# Patient Record
Sex: Female | Born: 1945 | Race: White | Hispanic: No | Smoking: Never smoker
Health system: Southern US, Community
[De-identification: ages and names within clinical notes are randomized; demographics above are authoritative.]

## PROBLEM LIST (undated history)

## (undated) DIAGNOSIS — R011 Cardiac murmur, unspecified: Secondary | ICD-10-CM

## (undated) DIAGNOSIS — J45909 Unspecified asthma, uncomplicated: Secondary | ICD-10-CM

## (undated) DIAGNOSIS — K219 Gastro-esophageal reflux disease without esophagitis: Secondary | ICD-10-CM

## (undated) DIAGNOSIS — Z860101 Personal history of adenomatous and serrated colon polyps: Secondary | ICD-10-CM

## (undated) DIAGNOSIS — N816 Rectocele: Secondary | ICD-10-CM

## (undated) DIAGNOSIS — F419 Anxiety disorder, unspecified: Secondary | ICD-10-CM

## (undated) DIAGNOSIS — H2513 Age-related nuclear cataract, bilateral: Secondary | ICD-10-CM

## (undated) DIAGNOSIS — K5792 Diverticulitis of intestine, part unspecified, without perforation or abscess without bleeding: Secondary | ICD-10-CM

## (undated) DIAGNOSIS — H33329 Round hole, unspecified eye: Secondary | ICD-10-CM

## (undated) DIAGNOSIS — B351 Tinea unguium: Secondary | ICD-10-CM

## (undated) DIAGNOSIS — N390 Urinary tract infection, site not specified: Secondary | ICD-10-CM

## (undated) DIAGNOSIS — F329 Major depressive disorder, single episode, unspecified: Secondary | ICD-10-CM

## (undated) DIAGNOSIS — J018 Other acute sinusitis: Secondary | ICD-10-CM

## (undated) DIAGNOSIS — Z86010 Personal history of colonic polyps: Secondary | ICD-10-CM

## (undated) DIAGNOSIS — E785 Hyperlipidemia, unspecified: Secondary | ICD-10-CM

## (undated) DIAGNOSIS — M1990 Unspecified osteoarthritis, unspecified site: Secondary | ICD-10-CM

## (undated) DIAGNOSIS — K589 Irritable bowel syndrome without diarrhea: Secondary | ICD-10-CM

## (undated) DIAGNOSIS — K5790 Diverticulosis of intestine, part unspecified, without perforation or abscess without bleeding: Secondary | ICD-10-CM

## (undated) DIAGNOSIS — J309 Allergic rhinitis, unspecified: Secondary | ICD-10-CM

## (undated) DIAGNOSIS — D352 Benign neoplasm of pituitary gland: Secondary | ICD-10-CM

## (undated) DIAGNOSIS — M8580 Other specified disorders of bone density and structure, unspecified site: Secondary | ICD-10-CM

## (undated) DIAGNOSIS — F32A Depression, unspecified: Secondary | ICD-10-CM

## (undated) DIAGNOSIS — H35371 Puckering of macula, right eye: Secondary | ICD-10-CM

## (undated) DIAGNOSIS — H35351 Cystoid macular degeneration, right eye: Secondary | ICD-10-CM

## (undated) HISTORY — DX: Major depressive disorder, single episode, unspecified: F32.9

## (undated) HISTORY — DX: Tinea unguium: B35.1

## (undated) HISTORY — PX: FLEXIBLE SIGMOIDOSCOPY: SHX1649

## (undated) HISTORY — DX: Round hole, unspecified eye: H33.329

## (undated) HISTORY — DX: Diverticulosis of intestine, part unspecified, without perforation or abscess without bleeding: K57.90

## (undated) HISTORY — PX: FOOT SURGERY: SHX648

## (undated) HISTORY — DX: Hyperlipidemia, unspecified: E78.5

## (undated) HISTORY — DX: Diverticulitis of intestine, part unspecified, without perforation or abscess without bleeding: K57.92

## (undated) HISTORY — DX: Gastro-esophageal reflux disease without esophagitis: K21.9

## (undated) HISTORY — PX: ESOPHAGOGASTRODUODENOSCOPY: SHX1529

## (undated) HISTORY — DX: Irritable bowel syndrome, unspecified: K58.9

## (undated) HISTORY — PX: CATARACT EXTRACTION: SUR2

## (undated) HISTORY — DX: Unspecified asthma, uncomplicated: J45.909

## (undated) HISTORY — DX: Allergic rhinitis, unspecified: J30.9

## (undated) HISTORY — DX: Rectocele: N81.6

## (undated) HISTORY — PX: COLONOSCOPY: SHX174

## (undated) HISTORY — DX: Other specified disorders of bone density and structure, unspecified site: M85.80

## (undated) HISTORY — PX: WISDOM TOOTH EXTRACTION: SHX21

## (undated) HISTORY — DX: Personal history of adenomatous and serrated colon polyps: Z86.0101

## (undated) HISTORY — DX: Other acute sinusitis: J01.80

## (undated) HISTORY — DX: Benign neoplasm of pituitary gland: D35.2

## (undated) HISTORY — DX: Depression, unspecified: F32.A

## (undated) HISTORY — DX: Personal history of colonic polyps: Z86.010

## (undated) HISTORY — DX: Anxiety disorder, unspecified: F41.9

---
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
## 1974-02-19 HISTORY — PX: BREAST BIOPSY: SHX20

## 1997-06-24 ENCOUNTER — Other Ambulatory Visit: Admission: RE | Admit: 1997-06-24 | Discharge: 1997-06-24 | Payer: Self-pay | Admitting: Obstetrics and Gynecology

## 1998-08-30 ENCOUNTER — Other Ambulatory Visit: Admission: RE | Admit: 1998-08-30 | Discharge: 1998-08-30 | Payer: Self-pay | Admitting: Obstetrics and Gynecology

## 1998-12-28 ENCOUNTER — Ambulatory Visit: Admission: RE | Admit: 1998-12-28 | Discharge: 1998-12-28 | Payer: Self-pay | Admitting: Pulmonary Disease

## 1999-01-11 ENCOUNTER — Encounter: Payer: Self-pay | Admitting: Gastroenterology

## 1999-01-11 ENCOUNTER — Encounter (INDEPENDENT_AMBULATORY_CARE_PROVIDER_SITE_OTHER): Payer: Self-pay

## 1999-01-11 ENCOUNTER — Other Ambulatory Visit: Admission: RE | Admit: 1999-01-11 | Discharge: 1999-01-11 | Payer: Self-pay | Admitting: Gastroenterology

## 1999-09-21 ENCOUNTER — Other Ambulatory Visit: Admission: RE | Admit: 1999-09-21 | Discharge: 1999-09-21 | Payer: Self-pay | Admitting: Obstetrics and Gynecology

## 2000-09-19 ENCOUNTER — Other Ambulatory Visit: Admission: RE | Admit: 2000-09-19 | Discharge: 2000-09-19 | Payer: Self-pay | Admitting: Obstetrics and Gynecology

## 2001-08-12 ENCOUNTER — Encounter: Payer: Self-pay | Admitting: Gastroenterology

## 2001-10-10 ENCOUNTER — Other Ambulatory Visit: Admission: RE | Admit: 2001-10-10 | Discharge: 2001-10-10 | Payer: Self-pay | Admitting: Obstetrics and Gynecology

## 2002-11-27 ENCOUNTER — Other Ambulatory Visit: Admission: RE | Admit: 2002-11-27 | Discharge: 2002-11-27 | Payer: Self-pay | Admitting: Obstetrics and Gynecology

## 2003-12-22 ENCOUNTER — Other Ambulatory Visit: Payer: Self-pay

## 2003-12-22 ENCOUNTER — Emergency Department: Payer: Self-pay | Admitting: Emergency Medicine

## 2004-02-17 ENCOUNTER — Other Ambulatory Visit: Admission: RE | Admit: 2004-02-17 | Discharge: 2004-02-17 | Payer: Self-pay | Admitting: Obstetrics and Gynecology

## 2004-07-26 ENCOUNTER — Ambulatory Visit: Payer: Self-pay | Admitting: Gastroenterology

## 2004-09-01 ENCOUNTER — Ambulatory Visit: Payer: Self-pay | Admitting: Gastroenterology

## 2004-09-18 ENCOUNTER — Emergency Department: Payer: Self-pay | Admitting: Emergency Medicine

## 2004-09-27 ENCOUNTER — Ambulatory Visit: Payer: Self-pay | Admitting: Gastroenterology

## 2004-10-02 ENCOUNTER — Ambulatory Visit: Payer: Self-pay | Admitting: Gastroenterology

## 2005-03-09 ENCOUNTER — Other Ambulatory Visit: Admission: RE | Admit: 2005-03-09 | Discharge: 2005-03-09 | Payer: Self-pay | Admitting: Obstetrics and Gynecology

## 2005-09-14 ENCOUNTER — Ambulatory Visit: Payer: Self-pay | Admitting: Critical Care Medicine

## 2006-03-22 ENCOUNTER — Ambulatory Visit: Payer: Self-pay | Admitting: Critical Care Medicine

## 2006-04-30 ENCOUNTER — Other Ambulatory Visit: Admission: RE | Admit: 2006-04-30 | Discharge: 2006-04-30 | Payer: Self-pay | Admitting: Obstetrics and Gynecology

## 2007-01-03 DIAGNOSIS — J4530 Mild persistent asthma, uncomplicated: Secondary | ICD-10-CM | POA: Insufficient documentation

## 2007-01-03 DIAGNOSIS — E785 Hyperlipidemia, unspecified: Secondary | ICD-10-CM | POA: Insufficient documentation

## 2007-01-03 DIAGNOSIS — J309 Allergic rhinitis, unspecified: Secondary | ICD-10-CM | POA: Insufficient documentation

## 2007-02-27 ENCOUNTER — Ambulatory Visit: Payer: Self-pay | Admitting: Critical Care Medicine

## 2007-05-05 ENCOUNTER — Other Ambulatory Visit: Admission: RE | Admit: 2007-05-05 | Discharge: 2007-05-05 | Payer: Self-pay | Admitting: Obstetrics and Gynecology

## 2007-08-05 ENCOUNTER — Telehealth: Payer: Self-pay | Admitting: Gastroenterology

## 2007-09-22 ENCOUNTER — Telehealth (INDEPENDENT_AMBULATORY_CARE_PROVIDER_SITE_OTHER): Payer: Self-pay | Admitting: *Deleted

## 2007-11-07 ENCOUNTER — Ambulatory Visit: Payer: Self-pay | Admitting: Critical Care Medicine

## 2007-11-26 ENCOUNTER — Telehealth (INDEPENDENT_AMBULATORY_CARE_PROVIDER_SITE_OTHER): Payer: Self-pay | Admitting: *Deleted

## 2007-12-10 ENCOUNTER — Encounter: Payer: Self-pay | Admitting: Critical Care Medicine

## 2008-01-02 ENCOUNTER — Encounter: Payer: Self-pay | Admitting: Critical Care Medicine

## 2008-03-16 ENCOUNTER — Telehealth (INDEPENDENT_AMBULATORY_CARE_PROVIDER_SITE_OTHER): Payer: Self-pay | Admitting: *Deleted

## 2008-03-19 ENCOUNTER — Encounter: Payer: Self-pay | Admitting: Critical Care Medicine

## 2008-04-30 ENCOUNTER — Telehealth (INDEPENDENT_AMBULATORY_CARE_PROVIDER_SITE_OTHER): Payer: Self-pay | Admitting: *Deleted

## 2008-05-07 ENCOUNTER — Ambulatory Visit: Payer: Self-pay | Admitting: Obstetrics and Gynecology

## 2008-05-07 ENCOUNTER — Encounter: Payer: Self-pay | Admitting: Obstetrics and Gynecology

## 2008-05-07 ENCOUNTER — Other Ambulatory Visit: Admission: RE | Admit: 2008-05-07 | Discharge: 2008-05-07 | Payer: Self-pay | Admitting: Obstetrics and Gynecology

## 2008-06-24 ENCOUNTER — Telehealth (INDEPENDENT_AMBULATORY_CARE_PROVIDER_SITE_OTHER): Payer: Self-pay | Admitting: *Deleted

## 2008-06-24 ENCOUNTER — Ambulatory Visit: Payer: Self-pay | Admitting: Critical Care Medicine

## 2008-07-01 ENCOUNTER — Telehealth (INDEPENDENT_AMBULATORY_CARE_PROVIDER_SITE_OTHER): Payer: Self-pay | Admitting: *Deleted

## 2008-07-01 ENCOUNTER — Telehealth: Payer: Self-pay | Admitting: Critical Care Medicine

## 2008-07-05 ENCOUNTER — Ambulatory Visit: Payer: Self-pay | Admitting: Gastroenterology

## 2008-07-05 DIAGNOSIS — Z860100 Personal history of colon polyps, unspecified: Secondary | ICD-10-CM | POA: Insufficient documentation

## 2008-07-15 ENCOUNTER — Telehealth (INDEPENDENT_AMBULATORY_CARE_PROVIDER_SITE_OTHER): Payer: Self-pay | Admitting: *Deleted

## 2008-07-16 ENCOUNTER — Ambulatory Visit: Payer: Self-pay | Admitting: Gastroenterology

## 2008-07-16 ENCOUNTER — Encounter: Payer: Self-pay | Admitting: Gastroenterology

## 2008-07-21 ENCOUNTER — Encounter: Payer: Self-pay | Admitting: Gastroenterology

## 2008-07-28 ENCOUNTER — Telehealth: Payer: Self-pay | Admitting: Critical Care Medicine

## 2008-07-29 ENCOUNTER — Encounter: Payer: Self-pay | Admitting: Gastroenterology

## 2008-08-24 ENCOUNTER — Telehealth: Payer: Self-pay | Admitting: Gastroenterology

## 2008-09-07 ENCOUNTER — Telehealth (INDEPENDENT_AMBULATORY_CARE_PROVIDER_SITE_OTHER): Payer: Self-pay | Admitting: *Deleted

## 2008-09-07 ENCOUNTER — Ambulatory Visit: Payer: Self-pay | Admitting: Pulmonary Disease

## 2008-09-20 ENCOUNTER — Telehealth (INDEPENDENT_AMBULATORY_CARE_PROVIDER_SITE_OTHER): Payer: Self-pay | Admitting: *Deleted

## 2008-09-23 ENCOUNTER — Telehealth: Payer: Self-pay | Admitting: Critical Care Medicine

## 2008-09-28 ENCOUNTER — Encounter: Payer: Self-pay | Admitting: Critical Care Medicine

## 2008-10-01 ENCOUNTER — Ambulatory Visit: Payer: Self-pay | Admitting: Internal Medicine

## 2008-10-01 ENCOUNTER — Telehealth (INDEPENDENT_AMBULATORY_CARE_PROVIDER_SITE_OTHER): Payer: Self-pay | Admitting: *Deleted

## 2008-10-01 DIAGNOSIS — R05 Cough: Secondary | ICD-10-CM

## 2008-10-01 DIAGNOSIS — R059 Cough, unspecified: Secondary | ICD-10-CM | POA: Insufficient documentation

## 2008-10-12 ENCOUNTER — Telehealth (INDEPENDENT_AMBULATORY_CARE_PROVIDER_SITE_OTHER): Payer: Self-pay | Admitting: *Deleted

## 2008-10-15 ENCOUNTER — Telehealth: Payer: Self-pay | Admitting: Gastroenterology

## 2008-10-15 ENCOUNTER — Telehealth: Payer: Self-pay | Admitting: Critical Care Medicine

## 2008-11-08 ENCOUNTER — Encounter: Payer: Self-pay | Admitting: Critical Care Medicine

## 2009-02-07 ENCOUNTER — Telehealth (INDEPENDENT_AMBULATORY_CARE_PROVIDER_SITE_OTHER): Payer: Self-pay | Admitting: *Deleted

## 2009-02-21 ENCOUNTER — Ambulatory Visit: Payer: Self-pay | Admitting: Gynecology

## 2009-05-12 ENCOUNTER — Ambulatory Visit: Payer: Self-pay | Admitting: Obstetrics and Gynecology

## 2009-05-12 ENCOUNTER — Other Ambulatory Visit: Admission: RE | Admit: 2009-05-12 | Discharge: 2009-05-12 | Payer: Self-pay | Admitting: Obstetrics and Gynecology

## 2009-06-28 ENCOUNTER — Ambulatory Visit: Payer: Self-pay | Admitting: Obstetrics and Gynecology

## 2009-06-28 ENCOUNTER — Telehealth (INDEPENDENT_AMBULATORY_CARE_PROVIDER_SITE_OTHER): Payer: Self-pay | Admitting: *Deleted

## 2009-07-01 ENCOUNTER — Ambulatory Visit: Payer: Self-pay | Admitting: Critical Care Medicine

## 2009-07-01 ENCOUNTER — Telehealth (INDEPENDENT_AMBULATORY_CARE_PROVIDER_SITE_OTHER): Payer: Self-pay | Admitting: *Deleted

## 2009-07-07 ENCOUNTER — Ambulatory Visit: Payer: Self-pay | Admitting: Obstetrics and Gynecology

## 2009-08-25 ENCOUNTER — Ambulatory Visit: Payer: Self-pay | Admitting: Obstetrics and Gynecology

## 2009-09-07 ENCOUNTER — Ambulatory Visit: Payer: Self-pay | Admitting: Obstetrics and Gynecology

## 2009-10-10 ENCOUNTER — Ambulatory Visit: Payer: Self-pay | Admitting: Unknown Physician Specialty

## 2009-10-12 LAB — PATHOLOGY REPORT

## 2009-11-07 ENCOUNTER — Telehealth (INDEPENDENT_AMBULATORY_CARE_PROVIDER_SITE_OTHER): Payer: Self-pay | Admitting: *Deleted

## 2009-11-22 ENCOUNTER — Encounter: Payer: Self-pay | Admitting: Critical Care Medicine

## 2010-01-25 ENCOUNTER — Telehealth (INDEPENDENT_AMBULATORY_CARE_PROVIDER_SITE_OTHER): Payer: Self-pay | Admitting: *Deleted

## 2010-03-09 ENCOUNTER — Telehealth: Payer: Self-pay | Admitting: Critical Care Medicine

## 2010-03-12 ENCOUNTER — Encounter: Payer: Self-pay | Admitting: Cardiovascular Disease

## 2010-03-23 NOTE — Medication Information (Signed)
Summary: Tax adviser   Imported By: Lehman Prom 11/22/2009 08:04:56  _____________________________________________________________________  External Attachment:    Type:   Image     Comment:   External Document

## 2010-03-23 NOTE — Progress Notes (Signed)
Summary: rx clarification- pharm calling  Phone Note From Pharmacy   [facility] [place] Call For: [facility]  Summary of Call: pharmacy req to confirm rx's for pt- especially re: chlor-trimeton and azelastine. call robin at [phone] Initial call taken by: Tivis Ringer, CNA,  Jul 01, 2009 3:06 PM  Follow-up for Phone Call        Pharmacist aware that the pt was RX'd 2 antihistamines per Dr. Delford Field and she should follow his instructions for use as instructed at OV today.Michel Bickers CMA  Jul 01, 2009 3:11 PM

## 2010-03-23 NOTE — Progress Notes (Signed)
  Phone Note Other Incoming   Request: Send information Summary of Call: Request for records received from [facility]. Request forwarded to [facility].     

## 2010-03-23 NOTE — Progress Notes (Signed)
Summary: question about pneumonia shot  Phone Note Call from Patient Call back at Home Phone [phone]   Caller: Patient Call For: Naomie Crow Summary of Call: Pt wants to know at what age should she start taking pnx shot. 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 I advsied the rec of 1 after age 64 years old becuase it shows no extra benefit to have additional vaccines after that age.  I also advsied pt that she could discuss with Dr. Delford Field or her PCP as well to see what their recs are. She states she will think about this and call if needed. Carron Curie CMA  March 09, 2010 11:44 AM

## 2010-03-23 NOTE — Progress Notes (Signed)
Summary: talk to nurse-PA for Veramyst- Approved!!!!  Phone Note Call from Patient   Caller: Patient Call For: wright Summary of Call: pt would like to talk to nurse about vermyst. Initial call taken by: Rickard Patience,  November 07, 2009 2:59 PM  Follow-up for Phone Call        [email or web address]. Carron Curie CMA  November 07, 2009 4:08 PM   pt returned our call.  pt states she received a letter in the mail from her ins company informing her she needed to contact us to let us know her prior auth for her Madilyn Hook is about to expire and therefore we need to call [employer] at [phone] and request renewal form.  Case # is [id number].  Called [employer] and requested renewal form be faxed to our office. Aundra Millet Reynolds LPN  November 07, 2009 4:49 PM   received prior auth form and put in PW's look at for him to fill out and sign.  Aundra Millet Reynolds LPN  November 10, 2009 9:15 AM    Additional Follow-up for Phone Call Additional follow up Details #1::        Form completed by PW and faxed back to [employer] at [phone].  Form placed in triage with the awaiting approval/denial forms.  Gweneth Dimitri RN  November 11, 2009 5:07 PM     Additional Follow-up for Phone Call Additional follow up Details #2::    received fax back from [employer].  Veramyst approved from 10/21/2009 until 11/11/2011.  Pt aware.  Aundra Millet Reynolds LPN  November 14, 2009 3:19 PM

## 2010-03-23 NOTE — Progress Notes (Signed)
Summary: prescript  Phone Note Call from Patient   Caller: Patient Call For: wright Summary of Call: need refill for singulair have appt with dr Delford Field on friday [facility] [address] [place] Initial call taken by: Rickard Patience,  Jun 28, 2009 4:14 PM  Follow-up for Phone Call        pt has pending appt with PW on Friday 07-01-2009. Sent rx for Singulair to pt's pharmacy.  Pt aware to keep pending appt for future refills.  Aundra Millet Reynolds LPN  Jun 28, 2009 4:35 PM     Prescriptions: SINGULAIR 10 MG TABS (MONTELUKAST SODIUM) Take 1 tablet by mouth once a day  #30 x 0   Entered by:   Arman Filter LPN   Authorized by:   Storm Frisk MD   Signed by:   Arman Filter LPN on 16/11/9602   Method used:   [facility]        [facility] [phone]* (retail)       [address] [place], [place]  [address]       Ph: [phone] or [phone]       Fax: [phone]   RxID:   [id number]
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
## 2010-05-16 ENCOUNTER — Encounter: Payer: Self-pay | Admitting: Obstetrics and Gynecology

## 2010-06-01 ENCOUNTER — Other Ambulatory Visit (HOSPITAL_COMMUNITY)
Admission: RE | Admit: 2010-06-01 | Discharge: 2010-06-01 | Disposition: A | Discharge status: Home / Self Care | Payer: BC Managed Care – PPO | Source: Ambulatory Visit | Attending: Obstetrics and Gynecology | Admitting: Obstetrics and Gynecology

## 2010-06-01 ENCOUNTER — Other Ambulatory Visit: Payer: Self-pay | Admitting: Obstetrics and Gynecology

## 2010-06-01 ENCOUNTER — Encounter (INDEPENDENT_AMBULATORY_CARE_PROVIDER_SITE_OTHER): Payer: BC Managed Care – PPO | Admitting: Obstetrics and Gynecology

## 2010-06-01 DIAGNOSIS — B373 Candidiasis of vulva and vagina: Secondary | ICD-10-CM

## 2010-06-01 DIAGNOSIS — Z01419 Encounter for gynecological examination (general) (routine) without abnormal findings: Secondary | ICD-10-CM

## 2010-06-01 DIAGNOSIS — R82998 Other abnormal findings in urine: Secondary | ICD-10-CM

## 2010-06-01 DIAGNOSIS — Z13220 Encounter for screening for lipoid disorders: Secondary | ICD-10-CM

## 2010-06-01 DIAGNOSIS — Z124 Encounter for screening for malignant neoplasm of cervix: Secondary | ICD-10-CM | POA: Insufficient documentation

## 2010-06-01 DIAGNOSIS — B3731 Acute candidiasis of vulva and vagina: Secondary | ICD-10-CM

## 2010-06-01 DIAGNOSIS — Z833 Family history of diabetes mellitus: Secondary | ICD-10-CM

## 2010-06-14 ENCOUNTER — Ambulatory Visit (INDEPENDENT_AMBULATORY_CARE_PROVIDER_SITE_OTHER): Payer: BC Managed Care – PPO | Admitting: Obstetrics and Gynecology

## 2010-06-14 ENCOUNTER — Other Ambulatory Visit: Payer: BC Managed Care – PPO

## 2010-06-14 DIAGNOSIS — N83339 Acquired atrophy of ovary and fallopian tube, unspecified side: Secondary | ICD-10-CM

## 2010-06-14 DIAGNOSIS — N950 Postmenopausal bleeding: Secondary | ICD-10-CM

## 2010-07-07 NOTE — Assessment & Plan Note (Signed)
[facility]                             PULMONARY OFFICE NOTE   NAME:[name], [name]                         MRN:          [id number]  DATE:03/22/2006                            DOB:          03-02-1945    This patient is seen for the first time since July 2007.  She has a  history of moderate persistent asthma.  She is here for reestablishment  for her pulmonary status, still having some nasal congestion, no real  cough, wheezing, or mucous production.  Maintained Zyrtec 10 mg daily,  Prilosec over-the-counter daily, Flonase 3 sprays each nostril b.i.d.,  Singulair 10 mg daily.  She is no longer on Advair.  She uses albuterol  p.r.n.   PHYSICAL EXAMINATION:  On exam, temp 98, blood pressure 108/72, pulse  80, saturation 99% room air.  CHEST:  Showed to be completely clear without evidence of wheeze, rale,  or rhonchi.  Note, there were no adventitious breath sounds.  CARDIAC EXAM:  Showed a regular rate and rhythm without S3, normal S1,  S2.  ABDOMEN:  Soft, nontender.  EXTREMITIES:  Showed no edema or clubbing.  NARIS:  Showed increased nasal inflammation, but no purulence.   IMPRESSION:  Is that of mild intermittent asthma, severe allergic  rhinitis, significant atopic features.   PLAN:  Switch to Xyzal one 5 mg daily.  Begin Veramyst 2 sprays b.i.d.  each nostril.  Discontinue Flonase, discontinue Zyrtec.  Maintain  Singulair.  Discontinue further Advair.  Maintain albuterol p.r.n.  Consider allergy evaluation.  The patient is taking this under  advisement, and she will return to this office in any case in 4 months.     Charlcie Cradle Delford Field, MD, [facility]  Electronically Signed    PEW/MedQ  DD: 03/22/2006  DT: 03/22/2006  Job #: [id number]

## 2010-07-07 NOTE — Assessment & Plan Note (Signed)
[facility]                               PULMONARY OFFICE NOTE   NAME:[name], [name]                         MRN:          [id number]  DATE:09/14/2005                            DOB:          April 17, 1945    CHIEF COMPLAINT:  Evaluate chronic asthma.   HISTORY OF PRESENT ILLNESS:  This is a 65 year old white female who needs to  establish here for pulmonary.  She had been seen by Dr. Adonis Huguenin in [place]  and wishes to establish in [place] for a pulmonary followup.  She has had  longstanding asthmatic bronchitis for many years.  She is currently in a  stable condition.  She has shortness of breath, extreme exertion.  She is  not short of breath at rest.  She occasionally has a productive cough when  she is having bronchitis flare-up and currently does not have any cough, has  only chest pain when very stressed.  She is not short of breath unless she  has extreme exertion.  She does have a history of sleep apnea that is being  evaluated by Dr. Kandis Cocking office.  She does have nasal congestion, denies  any acid heartburn or reflux symptoms, denies any change of appetite, denies  abdominal pain, difficulty swallowing, sore throat, tooth or dental  problems.  She has an Advair inhaler but only uses it p.r.n. and uses  albuterol only p.r.n.  She is referred now for further evaluation.   PAST MEDICAL HISTORY:  No history of hypertension or heart disease, does  have asthmatic bronchitis.  First diagnosed with asthma in 1991.  Has  hypercholesterolemia, chronic allergies and possible sleep apnea.   OPERATIVE HISTORY:  Three colonoscopies in the past, breast biopsy in 1976  was benign.  Tonsils were removed in 1950.   MEDICATION ALLERGIES:  SULFA CAUSE HIVES.   CURRENT MEDICATIONS:  1.  Zyrtec 10 mg daily.  2.  Prilosec over the counter, 1 daily.  3.  Rhinocort and Flonase both daily.  4.  Singulair 10 mg daily.  5.  Advair and albuterol p.r.n.   SOCIAL HISTORY:  Patient is a lifelong never smoker, is a Runner, broadcasting/film/video, was a  principle in the past, lives by herself, is divorced.   FAMILY HISTORY:  Emphysema in a father.  Allergies in mother and brother,  had asthma.  Mother and father had heart disease and the father with lung  cancer.   REVIEW OF SYSTEMS:  Otherwise noncontributory.  Does have a history of  anxiety and depression.   PHYSICAL EXAMINATION:  GENERAL:  This is a middle-aged female in no  distress.  VITAL SIGNS:  Temp 98.5, blood pressure 130/86, pulse 77, saturation 96% on  room air.  CHEST:  Showed distant breath sounds with prolonged expiratory phase.  No  wheeze or rhonchi noted.  CARDIAC:  Showed a regular rate and rhythm without S3.  Normal S1, S2.  ABDOMEN:  Soft, nontender.  EXTREMITIES:  No edema or clubbing or venous disease.  SKIN:  Clear.  NEUROLOGIC:  Intact.  HEENT:  No jugular venous distention,  no lymphadenopathy.  Oropharynx clear.  NECK:  Supple.   LABORATORY DATA:  Chest x-ray was previously obtained and reviewed and  revealed COPD changes, no acute infiltrate seen. Pulmonary functions are not  available for review but apparently have been done previously.   IMPRESSION:  Is that of asthmatic bronchitis with associated allergic  rhinitis.  Patient is currently stable at this time but does have pending  records from Dr. Adonis Huguenin that are being sent that we have not yet received.   RECOMMENDATIONS:  Would be to discontinue the Rhinocort and maintain  Flonase, two sprays each nostril daily, maintain Singulair 10 mg daily,  Prilosec over the counter daily.  The patient was instructed as to the use  of Advair on a more consistent basis at one spray b.i.d., 100/50 strength,  one spray b.i.d.  No other change in plan appears made, and we will await  records from Dr. Kinnie Scales office for making an additional recommendations.                                   Charlcie Cradle Delford Field, MD, [facility]   PEW/MedQ  DD:   09/17/2005  DT:  09/17/2005  Job #:  [id number]   cc:   Reuel Boom L. Eda Paschal, MD

## 2010-07-11 ENCOUNTER — Other Ambulatory Visit: Payer: Self-pay | Admitting: Critical Care Medicine

## 2010-07-13 NOTE — Telephone Encounter (Signed)
Pt was last seen 06/2009 and was told to f/u in 1 yr.  She has a pending appt with Dr. Delford Field for 08/01/2010.  Will send refills to last until OV.

## 2010-07-18 ENCOUNTER — Ambulatory Visit (INDEPENDENT_AMBULATORY_CARE_PROVIDER_SITE_OTHER): Payer: BC Managed Care – PPO | Admitting: Obstetrics and Gynecology

## 2010-07-18 DIAGNOSIS — B3731 Acute candidiasis of vulva and vagina: Secondary | ICD-10-CM

## 2010-07-18 DIAGNOSIS — N898 Other specified noninflammatory disorders of vagina: Secondary | ICD-10-CM

## 2010-07-18 DIAGNOSIS — B373 Candidiasis of vulva and vagina: Secondary | ICD-10-CM

## 2010-07-18 DIAGNOSIS — N760 Acute vaginitis: Secondary | ICD-10-CM

## 2010-07-20 ENCOUNTER — Telehealth: Payer: Self-pay | Admitting: Critical Care Medicine

## 2010-07-20 NOTE — Telephone Encounter (Signed)
Ok

## 2010-07-20 NOTE — Telephone Encounter (Signed)
Called and spoke with pt. Informed her we are working on PA for [employer] and will let her know when this has been approved or denied. PW, i placed the PA form in your look at stack for you to fill out and sign.  Thank you.

## 2010-07-21 DIAGNOSIS — M899 Disorder of bone, unspecified: Secondary | ICD-10-CM | POA: Insufficient documentation

## 2010-07-21 DIAGNOSIS — M8580 Other specified disorders of bone density and structure, unspecified site: Secondary | ICD-10-CM

## 2010-07-21 HISTORY — DX: Other specified disorders of bone density and structure, unspecified site: M85.80

## 2010-07-27 ENCOUNTER — Telehealth: Payer: Self-pay | Admitting: Critical Care Medicine

## 2010-07-27 MED ORDER — MONTELUKAST SODIUM 10 MG PO TABS: 10.0000 mg | ORAL_TABLET | Freq: Every day | ORAL | Status: DC

## 2010-07-27 NOTE — Telephone Encounter (Signed)
Rx refill for singulair sent to pharm.

## 2010-07-27 NOTE — Telephone Encounter (Signed)
PA has  Been done for singulair 10mg    Once daily,  i [facility].  i called and spoke with pt and she is aware that this has been approved through 07-27-2011

## 2010-07-27 NOTE — Telephone Encounter (Signed)
PA has been completed today and pt is aware that her meds have been approved and called to the pharmacy

## 2010-08-01 ENCOUNTER — Encounter (INDEPENDENT_AMBULATORY_CARE_PROVIDER_SITE_OTHER): Payer: BC Managed Care – PPO

## 2010-08-01 ENCOUNTER — Ambulatory Visit (INDEPENDENT_AMBULATORY_CARE_PROVIDER_SITE_OTHER): Payer: BC Managed Care – PPO | Admitting: Critical Care Medicine

## 2010-08-01 ENCOUNTER — Encounter: Payer: Self-pay | Admitting: Critical Care Medicine

## 2010-08-01 VITALS — BP 120/80 | HR 84 | Temp 98.3°F | Ht 64.0 in | Wt 178.6 lb

## 2010-08-01 DIAGNOSIS — M949 Disorder of cartilage, unspecified: Secondary | ICD-10-CM

## 2010-08-01 DIAGNOSIS — M899 Disorder of bone, unspecified: Secondary | ICD-10-CM

## 2010-08-01 DIAGNOSIS — J45909 Unspecified asthma, uncomplicated: Secondary | ICD-10-CM

## 2010-08-01 DIAGNOSIS — R350 Frequency of micturition: Secondary | ICD-10-CM

## 2010-08-01 DIAGNOSIS — N390 Urinary tract infection, site not specified: Secondary | ICD-10-CM

## 2010-08-01 MED ORDER — FLUTICASONE FUROATE 27.5 MCG/SPRAY NA SUSP: 2.0000 | Freq: Two times a day (BID) | NASAL | Status: DC

## 2010-08-01 MED ORDER — AZELASTINE HCL 0.15 % NA SOLN: 2.0000 | Freq: Every day | NASAL | Status: DC

## 2010-08-01 MED ORDER — MONTELUKAST SODIUM 10 MG PO TABS: 10.0000 mg | ORAL_TABLET | Freq: Every day | ORAL | Status: DC

## 2010-08-01 NOTE — Progress Notes (Signed)
Subjective:    Patient ID: [name], female    DOB: [birth year], 65 y.o.   MRN: [id number]  HPI 65 y.o. wf never smoker with history of moderate persistent asthma. . The patient had significant allergic rhinitis with postnasal drip syndrome and reflux disease aggravating a cyclic cough.  Jul 01, 2009 2:11 PM  This pt is doing well. No new issues. No wheeze or cough. No excess SABA use . Pt is controlled on singulair.  Pt denies any significant sore throat, nasal congestion or excess secretions, fever, chills, sweats, unintended weight loss, pleurtic or exertional chest pain, orthopnea PND, or leg swelling  Pt denies any increase in rescue therapy over baseline, denies waking up needing it or having any early am or nocturnal exacerbations of coughing/wheezing/or dyspnea.  Pt also denies any obvious fluctuation in symptoms wiht weather or environmental change or other alleviating or aggravating factors   08/01/2010 Since last ov, doing well,  Retired 2008 and stress with this.   Pt denies any significant sore throat, nasal congestion or excess secretions, fever, chills, sweats, unintended weight loss, pleurtic or exertional chest pain, orthopnea PND, or leg swelling Pt denies any increase in rescue therapy over baseline, denies waking up needing it or having any early am or nocturnal exacerbations of coughing/wheezing/or dyspnea. Pt also denies any obvious fluctuation in symptoms with  weather or environmental change or other alleviating or aggravating factors     Review of Systems Constitutional:   No  weight loss, night sweats,  Fevers, chills, fatigue, lassitude. HEENT:   No headaches,  Difficulty swallowing,  Tooth/dental problems,  Sore throat,                Notes some  Sneezing,notes some nose  itching, ear ache, nasal congestion, post nasal drip,   CV:  No chest pain,  Orthopnea, PND, swelling in lower extremities, anasarca, dizziness, palpitations  GI  No heartburn, indigestion,  abdominal pain, nausea, vomiting, diarrhea, change in bowel habits, loss of appetite  Resp: No shortness of breath with exertion or at rest.  No excess mucus, no productive cough,  No non-productive cough,  No coughing up of blood.  No change in color of mucus.  No wheezing.  No chest wall deformity  Skin: no rash or lesions.  GU: no dysuria, change in color of urine, no urgency or frequency.  No flank pain.  MS:  No joint pain or swelling.  No decreased range of motion.  No back pain.  Psych:  No change in mood or affect. No depression or anxiety.  No memory loss.     Objective:   Physical Exam Filed Vitals:   08/01/10 1433  BP: 120/80  Pulse: 84  Temp: 98.3 F (36.8 C)  TempSrc: Oral  Height: 5\' 4"  (1.626 m)  Weight: 178 lb 9.6 oz (81.012 kg)  SpO2: 97%    Gen: Pleasant, well-nourished, in no distress,  normal affect  ENT: No lesions,  mouth clear,  oropharynx clear, no postnasal drip  Neck: No JVD, no TMG, no carotid bruits  Lungs: No use of accessory muscles, no dullness to percussion, clear without rales or rhonchi  Cardiovascular: RRR, heart sounds normal, no murmur or gallops, no peripheral edema  Abdomen: soft and NT, no HSM,  BS normal  Musculoskeletal: No deformities, no cyanosis or clubbing  Neuro: alert, non focal  Skin: Warm, no lesions or rashes      PUL ASTHMA HISTORY 08/01/2010  Symptoms 0-2 days/week  Nighttime  awakenings 0-2/month  Interference with activity No limitations  SABA use 0-2 days/wk  Exacerbations requiring oral steroids 0-1 / year    Assessment & Plan:   ASTHMA Cyclic cough due to mild intermittent asthma stable Plan No change in inhaled or maintenance medications. Return in  12 months     Updated Medication List Outpatient Encounter Prescriptions as of 08/01/2010  Medication Sig Dispense Refill  . albuterol (PROAIR HFA) 108 (90 BASE) MCG/ACT inhaler Inhale 2 puffs into the lungs every 6 (six) hours as needed.        Marland Kitchen  aspirin 81 MG tablet Take 81 mg by mouth every other day.        Marland Kitchen atorvastatin (LIPITOR) 40 MG tablet Take 20 mg by mouth daily. Take 1/2 once daily.       . Azelastine HCl (ASTEPRO) 0.15 % SOLN Place 2 puffs into the nose daily.  30 mL  6  . calcium citrate-vitamin D 200-200 MG-UNIT TABS Take 2 tablets by mouth daily.        . cetirizine (ZYRTEC) 10 MG tablet Take 10 mg by mouth daily.        . chlorpheniramine (CHLOR-TRIMETON) 4 MG tablet Take 4 mg by mouth daily.        . fish oil-omega-3 fatty acids 1000 MG capsule Take 1 g by mouth every other day.        . fluticasone (VERAMYST) 27.5 MCG/SPRAY nasal spray Place 2 sprays into the nose 2 (two) times daily.  10 g  6  . montelukast (SINGULAIR) 10 MG tablet Take 1 tablet (10 mg total) by mouth at bedtime.  30 tablet  0  . Multiple Vitamin (MULTIVITAMIN) tablet Take 1 tablet by mouth daily.        . ranitidine (ZANTAC) 150 MG capsule Take 150 mg by mouth 2 (two) times daily.        Marland Kitchen DISCONTD: Azelastine HCl (ASTEPRO) 0.15 % SOLN Place into the nose daily.       Marland Kitchen DISCONTD: montelukast (SINGULAIR) 10 MG tablet Take 1 tablet (10 mg total) by mouth at bedtime.  30 tablet  0  . DISCONTD: VERAMYST 27.5 MCG/SPRAY nasal spray 2 SPRAYS EACH NOSTRIL ONCE DAILY  1 Bottle  0

## 2010-08-01 NOTE — Patient Instructions (Signed)
No change in medications. Return in       12 months, call sooner if worse

## 2010-08-03 NOTE — Assessment & Plan Note (Addendum)
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 Return in  12 months

## 2010-08-09 ENCOUNTER — Ambulatory Visit: Payer: BC Managed Care – PPO | Admitting: Obstetrics and Gynecology

## 2010-08-09 ENCOUNTER — Other Ambulatory Visit: Payer: BC Managed Care – PPO

## 2010-08-14 ENCOUNTER — Telehealth: Payer: Self-pay | Admitting: *Deleted

## 2010-08-14 NOTE — Telephone Encounter (Signed)
Per previous phone messages, singulair PA has already been done and approved -- pt and pharm aware of this per previous phone messages.  We also have an approval letter for singulair from 07/06/2010 - 07/27/2011.  Will have this scanned into chart.

## 2010-08-28 ENCOUNTER — Other Ambulatory Visit: Payer: Self-pay | Admitting: Critical Care Medicine

## 2010-08-29 NOTE — Telephone Encounter (Signed)
Rx was last sent on 08/01/10 # 1 x 6 -- Called pharm, spoke with Brett Canales.  He states they did receive the rx from June.  This rx was sent in error.

## 2010-11-21 ENCOUNTER — Other Ambulatory Visit: Payer: Self-pay | Admitting: *Deleted

## 2010-11-21 ENCOUNTER — Telehealth: Payer: Self-pay | Admitting: Critical Care Medicine

## 2010-11-21 DIAGNOSIS — J45909 Unspecified asthma, uncomplicated: Secondary | ICD-10-CM

## 2010-11-21 MED ORDER — MONTELUKAST SODIUM 10 MG PO TABS: 10.0000 mg | ORAL_TABLET | Freq: Every day | ORAL | Status: DC

## 2010-11-21 NOTE — Telephone Encounter (Signed)
Called, spoke with pt.  States she was told generic singulair needed authorization.  Looks like rx was sent in on 11/21/10 # 30 x 6 - ? If this is needing a prior authorization.  I called [facility] and spoke with Dieu-ha who states they were needing a refill authorization - they did receive the rx sent earlier today.  Per Dieu-ha, no PA is needed at this time.  Therefore, nothing further needed at this time.  I called pt back and informed her of this.  She verbalized understanding.  Nothing further needed at this time.

## 2010-11-30 ENCOUNTER — Encounter: Payer: Self-pay | Admitting: Critical Care Medicine

## 2010-11-30 ENCOUNTER — Ambulatory Visit (INDEPENDENT_AMBULATORY_CARE_PROVIDER_SITE_OTHER): Payer: BC Managed Care – PPO | Admitting: Critical Care Medicine

## 2010-11-30 DIAGNOSIS — J45909 Unspecified asthma, uncomplicated: Secondary | ICD-10-CM

## 2010-11-30 DIAGNOSIS — J309 Allergic rhinitis, unspecified: Secondary | ICD-10-CM

## 2010-11-30 MED ORDER — FLUTICASONE FUROATE 27.5 MCG/SPRAY NA SUSP: 2.0000 | Freq: Every day | NASAL | Status: DC

## 2010-11-30 MED ORDER — AZELASTINE HCL 0.15 % NA SOLN: 2.0000 | Freq: Every day | NASAL | Status: DC

## 2010-11-30 MED ORDER — ALBUTEROL SULFATE HFA 108 (90 BASE) MCG/ACT IN AERS: 2.0000 | INHALATION_SPRAY | Freq: Four times a day (QID) | RESPIRATORY_TRACT | Status: DC | PRN

## 2010-11-30 NOTE — Progress Notes (Signed)
Subjective:    Patient ID: [name], female    DOB: [birth year], 65 y.o.   MRN: [id number]  HPI  65 y.o. wf never smoker with history of moderate persistent asthma. . The patient had significant allergic rhinitis with postnasal drip syndrome and reflux disease aggravating a cyclic cough.  Jul 01, 2009 2:11 PM  This pt is doing well. No new issues. No wheeze or cough. No excess SABA use . Pt is controlled on singulair.  Pt denies any significant sore throat, nasal congestion or excess secretions, fever, chills, sweats, unintended weight loss, pleurtic or exertional chest pain, orthopnea PND, or leg swelling  Pt denies any increase in rescue therapy over baseline, denies waking up needing it or having any early am or nocturnal exacerbations of coughing/wheezing/or dyspnea.  Pt also denies any obvious fluctuation in symptoms wiht weather or environmental change or other alleviating or aggravating factors   6/12 Since last ov, doing well,  Retired 2008 and stress with this.   Pt denies any significant sore throat, nasal congestion or excess secretions, fever, chills, sweats, unintended weight loss, pleurtic or exertional chest pain, orthopnea PND, or leg swelling Pt denies any increase in rescue therapy over baseline, denies waking up needing it or having any early am or nocturnal exacerbations of coughing/wheezing/or dyspnea. Pt also denies any obvious fluctuation in symptoms with  weather or environmental change or other alleviating or aggravating factors  11/30/2010 3weeks ago started after weed eating, dusty area,  L side.  Sore throat after ate bag of starburst candy 1.5 weeks ago. Pain was dull ache and tightness and becoming more sharp, no change with deep breath.  No real wheeze.  Has GERD, ? May have had more reflux symptoms.  No sore throat is better now.  Pain is better now. Asthma History:   Symptoms  0-2 days/week         Nighttime Awakenings  0-2/month         Asthma interference  with normal activity  No limitations         SABA use (not for EIB)  0-2 days/wk         Risk: Exacerbations requiring oral systemic steroids  0-1 / year            Past Medical History  Diagnosis Date  . Esophageal reflux   . Other acute sinusitis   . Other and unspecified hyperlipidemia   . Unspecified asthma   . Allergic rhinitis, cause unspecified   . Depression   . Chronic bronchitis   . IBS (irritable bowel syndrome)   . Diverticulosis   . Anxiety disorder   . Hx of adenomatous colonic polyps      Family History  Problem Relation Age of Onset  . Colon cancer Father   . Lung cancer Father   . Heart attack Father   . Heart attack Mother   . Liver disease Maternal Grandmother   . Colon polyps Brother   . Prostate cancer Maternal Uncle   . Breast cancer Paternal Aunt   . Breast cancer Paternal Grandmother   . Diabetes Paternal Grandmother   . Stomach cancer Father      History   Social History  . Marital Status: Divorced    Spouse Name: N/A    Number of Children: N/A  . Years of Education: N/A   Occupational History  . retired Runner, broadcasting/film/video    Social History Main Topics  . Smoking status: Never Smoker   .  Smokeless tobacco: Never Used  . Alcohol Use: Not on file  . Drug Use: Not on file  . Sexually Active: Not on file   Other Topics Concern  . Not on file   Social History Narrative  . No narrative on file     Allergies  Allergen Reactions  . Sulfonamide Derivatives      Outpatient Prescriptions Prior to Visit  Medication Sig Dispense Refill  . aspirin 81 MG tablet Take 81 mg by mouth every other day.        Marland Kitchen atorvastatin (LIPITOR) 40 MG tablet Take 20 mg by mouth daily. Take 1/2 once daily.       . calcium citrate-vitamin D 200-200 MG-UNIT TABS Take 2 tablets by mouth daily.        . cetirizine (ZYRTEC) 10 MG tablet Take 10 mg by mouth daily.        . chlorpheniramine (CHLOR-TRIMETON) 4 MG tablet Take 4 mg by mouth daily.        . fish oil-omega-3  fatty acids 1000 MG capsule Take 1 g by mouth every other day.        . montelukast (SINGULAIR) 10 MG tablet Take 1 tablet (10 mg total) by mouth at bedtime.  30 tablet  6  . Multiple Vitamin (MULTIVITAMIN) tablet Take 1 tablet by mouth daily.        . ranitidine (ZANTAC) 150 MG capsule Take 150 mg by mouth 2 (two) times daily.        Marland Kitchen albuterol (PROAIR HFA) 108 (90 BASE) MCG/ACT inhaler Inhale 2 puffs into the lungs every 6 (six) hours as needed.        . Azelastine HCl (ASTEPRO) 0.15 % SOLN Place 2 puffs into the nose daily.  30 mL  6  . fluticasone (VERAMYST) 27.5 MCG/SPRAY nasal spray Place 2 sprays into the nose 2 (two) times daily.  10 g  6      Review of Systems  Constitutional:   No  weight loss, night sweats,  Fevers, chills, fatigue, lassitude. HEENT:   No headaches,  Difficulty swallowing,  Tooth/dental problems,  Sore throat,                Notes some  Sneezing,notes some nose  itching, ear ache, nasal congestion, post nasal drip,   CV:  No chest pain,  Orthopnea, PND, swelling in lower extremities, anasarca, dizziness, palpitations  GI  Notes  Heartburn, no  indigestion, abdominal pain, nausea, vomiting, diarrhea, change in bowel habits, loss of appetite  Resp: No shortness of breath with exertion or at rest.  No excess mucus, no productive cough,  No non-productive cough,  No coughing up of blood.  No change in color of mucus.  No wheezing.  No chest wall deformity  Skin: no rash or lesions.  GU: no dysuria, change in color of urine, no urgency or frequency.  No flank pain.  MS:  No joint pain or swelling.  No decreased range of motion.  No back pain.  Psych:  No change in mood or affect. No depression or anxiety.  No memory loss.     Objective:   Physical Exam  Filed Vitals:   11/30/10 1024  BP: 138/82  Pulse: 80  Temp: 98.3 F (36.8 C)  TempSrc: Oral  Height: 5' 3.5" (1.613 m)  Weight: 176 lb (79.833 kg)  SpO2: 99%    Gen: Pleasant, well-nourished, in  no distress,  normal affect  ENT: No lesions,  mouth clear,  oropharynx clear, no postnasal drip  Neck: No JVD, no TMG, no carotid bruits  Lungs: No use of accessory muscles, no dullness to percussion, clear   Cardiovascular: RRR, heart sounds normal, no murmur or gallops, no peripheral edema  Abdomen: soft and NT, no HSM,  BS normal  Musculoskeletal: No deformities, no cyanosis or clubbing  Neuro: alert, non focal  Skin: Warm, no lesions or rashes       Assessment & Plan:   ASTHMA Moderate persistent asthma with recent pulled muscle in L intercostals and GERD flare d/t excess candy sugared use Plan Minimize sugar candy No changes in maintenance asthma meds      Updated Medication List Outpatient Encounter Prescriptions as of 11/30/2010  Medication Sig Dispense Refill  . albuterol (PROAIR HFA) 108 (90 BASE) MCG/ACT inhaler Inhale 2 puffs into the lungs every 6 (six) hours as needed.  1 Inhaler  6  . aspirin 81 MG tablet Take 81 mg by mouth every other day.        Marland Kitchen atorvastatin (LIPITOR) 40 MG tablet Take 20 mg by mouth daily. Take 1/2 once daily.       . Azelastine HCl (ASTEPRO) 0.15 % SOLN Place 2 puffs into the nose daily.  30 mL  6  . calcium citrate-vitamin D 200-200 MG-UNIT TABS Take 2 tablets by mouth daily.        . cetirizine (ZYRTEC) 10 MG tablet Take 10 mg by mouth daily.        . chlorpheniramine (CHLOR-TRIMETON) 4 MG tablet Take 4 mg by mouth daily.        . fish oil-omega-3 fatty acids 1000 MG capsule Take 1 g by mouth every other day.        . fluticasone (VERAMYST) 27.5 MCG/SPRAY nasal spray Place 2 sprays into the nose daily.  10 g  6  . montelukast (SINGULAIR) 10 MG tablet Take 1 tablet (10 mg total) by mouth at bedtime.  30 tablet  6  . Multiple Vitamin (MULTIVITAMIN) tablet Take 1 tablet by mouth daily.        . ranitidine (ZANTAC) 150 MG capsule Take 150 mg by mouth 2 (two) times daily.        Marland Kitchen DISCONTD: albuterol (PROAIR HFA) 108 (90 BASE) MCG/ACT  inhaler Inhale 2 puffs into the lungs every 6 (six) hours as needed.        Marland Kitchen DISCONTD: Azelastine HCl (ASTEPRO) 0.15 % SOLN Place 2 puffs into the nose daily.  30 mL  6  . DISCONTD: fluticasone (VERAMYST) 27.5 MCG/SPRAY nasal spray Place 2 sprays into the nose 2 (two) times daily.  10 g  6  . DISCONTD: fluticasone (VERAMYST) 27.5 MCG/SPRAY nasal spray Place 2 sprays into the nose daily.

## 2010-11-30 NOTE — Patient Instructions (Signed)
No change in medications. Return in        6 months        

## 2010-11-30 NOTE — Assessment & Plan Note (Signed)
Moderate persistent asthma with recent pulled muscle in L intercostals and GERD flare d/t excess candy sugared use Plan Minimize sugar candy No changes in maintenance asthma meds

## 2010-12-26 ENCOUNTER — Other Ambulatory Visit: Payer: Self-pay | Admitting: Critical Care Medicine

## 2010-12-26 ENCOUNTER — Telehealth: Payer: Self-pay | Admitting: Critical Care Medicine

## 2010-12-26 DIAGNOSIS — J45909 Unspecified asthma, uncomplicated: Secondary | ICD-10-CM

## 2010-12-26 NOTE — Telephone Encounter (Signed)
Spoke to the pharmacy about generic singulair and they state the pt has 5 refills on this med and will go ahead and get this ready for her --ATC pt but NA just wanted to let her know this has been taken care of, according to the pharmacist pt is on her way back up there to p/u now.

## 2010-12-26 NOTE — Telephone Encounter (Signed)
Pt requests that we ensure all of her meds have been renewed & sent to her pharmacy, [facility] on [address] in [place], [place].  [name]

## 2011-01-29 ENCOUNTER — Encounter: Payer: Self-pay | Admitting: Adult Health

## 2011-01-29 ENCOUNTER — Ambulatory Visit (INDEPENDENT_AMBULATORY_CARE_PROVIDER_SITE_OTHER): Payer: BC Managed Care – PPO | Admitting: Adult Health

## 2011-01-29 VITALS — BP 114/66 | HR 80 | Temp 98.3°F | Ht 63.5 in | Wt 176.0 lb

## 2011-01-29 DIAGNOSIS — J40 Bronchitis, not specified as acute or chronic: Secondary | ICD-10-CM

## 2011-01-29 MED ORDER — CEFDINIR 300 MG PO CAPS: 300.0000 mg | ORAL_CAPSULE | Freq: Two times a day (BID) | ORAL | Status: AC

## 2011-01-29 NOTE — Progress Notes (Signed)
Subjective:    Patient ID: [name], female    DOB: April 06, 1945, 65 y.o.   MRN: [id number]  HPI  65 y.o. wf never smoker with history of moderate persistent asthma. . The patient had significant allergic rhinitis with postnasal drip syndrome and reflux disease aggravating a cyclic cough.  Jul 01, 2009 2:11 PM  This pt is doing well. No new issues. No wheeze or cough. No excess SABA use . Pt is controlled on singulair.  Pt denies any significant sore throat, nasal congestion or excess secretions, fever, chills, sweats, unintended weight loss, pleurtic or exertional chest pain, orthopnea PND, or leg swelling  Pt denies any increase in rescue therapy over baseline, denies waking up needing it or having any early am or nocturnal exacerbations of coughing/wheezing/or dyspnea.  Pt also denies any obvious fluctuation in symptoms wiht weather or environmental change or other alleviating or aggravating factors   6/12 Since last ov, doing well,  Retired 2008 and stress with this.   Pt denies any significant sore throat, nasal congestion or excess secretions, fever, chills, sweats, unintended weight loss, pleurtic or exertional chest pain, orthopnea PND, or leg swelling Pt denies any increase in rescue therapy over baseline, denies waking up needing it or having any early am or nocturnal exacerbations of coughing/wheezing/or dyspnea. Pt also denies any obvious fluctuation in symptoms with  weather or environmental change or other alleviating or aggravating factors  11/30/2010 3weeks ago started after weed eating, dusty area,  L side.  Sore throat after ate bag of starburst candy 1.5 weeks ago. Pain was dull ache and tightness and becoming more sharp, no change with deep breath.  No real wheeze.  Has GERD, ? May have had more reflux symptoms.  No sore throat is better now.  Pain is better now.  01/29/11 Acute OV  Complains of prod cough with clear mucus, sinus congestion with "rusty" colored drainage,  PND, wheezing x1week.  reports temp up to 101 at onset of symptoms. Fever and aches are gone now but cough is getting worse. Rare wheezing. No chest pain or edema. OTC not working.    Past Medical History  Diagnosis Date  . Esophageal reflux   . Other acute sinusitis   . Other and unspecified hyperlipidemia   . Unspecified asthma   . Allergic rhinitis, cause unspecified   . Depression   . Chronic bronchitis   . IBS (irritable bowel syndrome)   . Diverticulosis   . Anxiety disorder   . Hx of adenomatous colonic polyps      Family History  Problem Relation Age of Onset  . Colon cancer Father   . Lung cancer Father   . Heart attack Father   . Heart attack Mother   . Liver disease Maternal Grandmother   . Colon polyps Brother   . Prostate cancer Maternal Uncle   . Breast cancer Paternal Aunt   . Breast cancer Paternal Grandmother   . Diabetes Paternal Grandmother   . Stomach cancer Father      History   Social History  . Marital Status: Divorced    Spouse Name: N/A    Number of Children: N/A  . Years of Education: N/A   Occupational History  . retired Runner, broadcasting/film/video    Social History Main Topics  . Smoking status: Never Smoker   . Smokeless tobacco: Never Used  . Alcohol Use: Not on file  . Drug Use: Not on file  . Sexually Active: Not on file  Other Topics Concern  . Not on file   Social History Narrative  . No narrative on file     Allergies  Allergen Reactions  . Naproxen     Ulcers per pt  . Sulfonamide Derivatives      Outpatient Prescriptions Prior to Visit  Medication Sig Dispense Refill  . albuterol (PROAIR HFA) 108 (90 BASE) MCG/ACT inhaler Inhale 2 puffs into the lungs every 6 (six) hours as needed.  1 Inhaler  6  . aspirin 81 MG tablet Take 81 mg by mouth every other day.        Marland Kitchen atorvastatin (LIPITOR) 40 MG tablet Take 20 mg by mouth daily. Take 1/2 once daily.       . Azelastine HCl (ASTEPRO) 0.15 % SOLN Place 2 puffs into the nose daily.  30  mL  6  . calcium citrate-vitamin D 200-200 MG-UNIT TABS Take 2 tablets by mouth daily.        . cetirizine (ZYRTEC) 10 MG tablet Take 10 mg by mouth daily.        . chlorpheniramine (CHLOR-TRIMETON) 4 MG tablet Take 4 mg by mouth daily.        . fish oil-omega-3 fatty acids 1000 MG capsule Take 1 g by mouth every other day.        . fluticasone (VERAMYST) 27.5 MCG/SPRAY nasal spray Place 2 sprays into the nose daily.  10 g  6  . montelukast (SINGULAIR) 10 MG tablet Take 1 tablet (10 mg total) by mouth at bedtime.  30 tablet  6  . Multiple Vitamin (MULTIVITAMIN) tablet Take 1 tablet by mouth daily.        . ranitidine (ZANTAC) 150 MG capsule Take 150 mg by mouth 2 (two) times daily.        . montelukast (SINGULAIR) 10 MG tablet TAKE 1 TABLET (10 MG TOTAL) BY MOUTH AT BEDTIME.  30 tablet  3      Review of Systems  Constitutional:   No  weight loss, night sweats,  Fevers, chills, fatigue, lassitude. HEENT:   No headaches,  Difficulty swallowing,  Tooth/dental problems,  Sore throat,   ++ nasal congestion, post nasal drip,   CV:  No chest pain,  Orthopnea, PND, swelling in lower extremities, anasarca, dizziness, palpitations  GI  Notes  Heartburn, no  indigestion, abdominal pain, nausea, vomiting, diarrhea, change in bowel habits, loss of appetite  Resp:    No coughing up of blood.    No chest wall deformity  Skin: no rash or lesions.  GU: no dysuria, change in color of urine, no urgency or frequency.  No flank pain.  MS:  No joint pain or swelling.  No decreased range of motion.  No back pain.  Psych:  No change in mood or affect. No depression or anxiety.  No memory loss.     Objective:   Physical Exam  Filed Vitals:   01/29/11 1540  BP: 114/66  Pulse: 80  Temp: 98.3 F (36.8 C)  TempSrc: Oral  Height: 5' 3.5" (1.613 m)  Weight: 176 lb (79.833 kg)  SpO2: 96%    Gen: Pleasant, well-nourished, in no distress,  normal affect  ENT: No lesions,  mouth clear,  oropharynx  clear,    Neck: No JVD, no TMG, no carotid bruits  Lungs: No use of accessory muscles, no dullness to percussion, coarse BS w/ no wheezing   Cardiovascular: RRR, heart sounds normal, no murmur or gallops, no peripheral edema  Abdomen: soft and NT, no HSM,  BS normal  Musculoskeletal: No deformities, no cyanosis or clubbing  Neuro: alert, non focal  Skin: Warm, no lesions or rashes       Assessment & Plan:   Bronchitis Flare   Plan ; Omnicef 300mg  Twice daily  For 7 days  Mucinex DM Twice daily  As needed  Cough/congestion  Fluids and rest  Saline nasal rinses As needed   Tylenol As needed   Please contact office for sooner follow up if symptoms do not improve or worsen or seek emergency care  follow up Dr. Delford Field  As planned and As needed          Updated Medication List Outpatient Encounter Prescriptions as of 01/29/2011  Medication Sig Dispense Refill  . albuterol (PROAIR HFA) 108 (90 BASE) MCG/ACT inhaler Inhale 2 puffs into the lungs every 6 (six) hours as needed.  1 Inhaler  6  . aspirin 81 MG tablet Take 81 mg by mouth every other day.        Marland Kitchen atorvastatin (LIPITOR) 40 MG tablet Take 20 mg by mouth daily. Take 1/2 once daily.       . Azelastine HCl (ASTEPRO) 0.15 % SOLN Place 2 puffs into the nose daily.  30 mL  6  . calcium citrate-vitamin D 200-200 MG-UNIT TABS Take 2 tablets by mouth daily.        . cetirizine (ZYRTEC) 10 MG tablet Take 10 mg by mouth daily.        . chlorpheniramine (CHLOR-TRIMETON) 4 MG tablet Take 4 mg by mouth daily.        . fish oil-omega-3 fatty acids 1000 MG capsule Take 1 g by mouth every other day.        . fluticasone (VERAMYST) 27.5 MCG/SPRAY nasal spray Place 2 sprays into the nose daily.  10 g  6  . montelukast (SINGULAIR) 10 MG tablet Take 1 tablet (10 mg total) by mouth at bedtime.  30 tablet  6  . Multiple Vitamin (MULTIVITAMIN) tablet Take 1 tablet by mouth daily.        . ranitidine (ZANTAC) 150 MG capsule Take 150 mg  by mouth 2 (two) times daily.        Marland Kitchen DISCONTD: montelukast (SINGULAIR) 10 MG tablet TAKE 1 TABLET (10 MG TOTAL) BY MOUTH AT BEDTIME.  30 tablet  3

## 2011-01-29 NOTE — Patient Instructions (Signed)
Omnicef 300mg  Twice daily  For 7 days  Mucinex DM Twice daily  As needed  Cough/congestion  Fluids and rest  Saline nasal rinses As needed   Tylenol As needed   Please contact office for sooner follow up if symptoms do not improve or worsen or seek emergency care  follow up Dr. Delford Field  As planned and As needed

## 2011-01-30 DIAGNOSIS — J40 Bronchitis, not specified as acute or chronic: Secondary | ICD-10-CM | POA: Insufficient documentation

## 2011-01-30 NOTE — Assessment & Plan Note (Signed)
Flare   Plan ; Omnicef 300mg  Twice daily  For 7 days  Mucinex DM Twice daily  As needed  Cough/congestion  Fluids and rest  Saline nasal rinses As needed   Tylenol As needed   Please contact office for sooner follow up if symptoms do not improve or worsen or seek emergency care  follow up Dr. Delford Field  As planned and As needed

## 2011-02-06 ENCOUNTER — Telehealth: Payer: Self-pay | Admitting: Adult Health

## 2011-02-06 ENCOUNTER — Ambulatory Visit (INDEPENDENT_AMBULATORY_CARE_PROVIDER_SITE_OTHER): Payer: BC Managed Care – PPO | Admitting: Pulmonary Disease

## 2011-02-06 ENCOUNTER — Telehealth: Payer: Self-pay | Admitting: Pulmonary Disease

## 2011-02-06 ENCOUNTER — Encounter: Payer: Self-pay | Admitting: Pulmonary Disease

## 2011-02-06 DIAGNOSIS — J40 Bronchitis, not specified as acute or chronic: Secondary | ICD-10-CM

## 2011-02-06 DIAGNOSIS — J45909 Unspecified asthma, uncomplicated: Secondary | ICD-10-CM

## 2011-02-06 MED ORDER — PREDNISONE (PAK) 10 MG PO TABS: ORAL_TABLET | ORAL | Status: DC

## 2011-02-06 MED ORDER — PREDNISONE (PAK) 10 MG PO TABS: 10.0000 mg | ORAL_TABLET | Freq: Every day | ORAL | Status: DC

## 2011-02-06 NOTE — Telephone Encounter (Signed)
ATC x 1. NA. Msg says memory is full. WCB later.

## 2011-02-06 NOTE — Assessment & Plan Note (Signed)
The patient has had a recent episode of acute bronchitis and is being treated with antibiotics, however most recently she has had some symptoms that are suggestive of increased airway tone and probable bronchospasm.  She did see some relief with her albuterol rescue.  Currently she is without wheezing, and she feels that her breathing is adequate.  Because of the upcoming holiday weekend, we'll give her a prescription of prednisone to hold, and she is to start taking if she begins to have increasing shortness of breath and more symptoms suggestive of an asthma flare.  If she continues to have her chest discomfort, she may need a chest x-ray for completeness.  There is nothing on exam however to suggest pneumonia currently.

## 2011-02-06 NOTE — Telephone Encounter (Signed)
I spoke with pt and she c/o pain b/w her left shoulder blade, PND, cough, throat feels congested. Pt is scheduled to come in and see KC today at 2:00.

## 2011-02-06 NOTE — Progress Notes (Signed)
  Subjective:    Patient ID: [name], female    DOB: 04/09/1945, 65 y.o.   MRN: [id number]  HPI The patient comes in today for an acute sick visit.  She has known asthma, and was recently seen by the nurse practitioner for sounds like acute bronchitis.  She was placed on an antibiotic, and did see some improvement in her chest congestion and mucus production.  However, the last 24-48 hours she has had some symptoms of chest tightness and some increasing shortness of breath.  She has used her albuterol rescue inhaler for this with some improvement.  Currently feels like she is breathing okay, but is concerned about the upcoming holiday weekend.  She is still having some atypical chest pain that has been an ongoing issue for her.  She denies any current fevers, chills, or sweats.   Review of Systems  Constitutional: Negative for fever and unexpected weight change.  HENT: Positive for congestion, sore throat, rhinorrhea, trouble swallowing, postnasal drip and sinus pressure. Negative for ear pain, nosebleeds, sneezing and dental problem.   Eyes: Positive for redness and itching.  Respiratory: Positive for cough, chest tightness, shortness of breath and wheezing.   Cardiovascular: Negative for palpitations and leg swelling.  Gastrointestinal: Positive for nausea. Negative for vomiting.  Genitourinary: Negative for dysuria.  Musculoskeletal: Negative for joint swelling.  Skin: Negative for rash.  Neurological: Positive for headaches.  Hematological: Does not bruise/bleed easily.  Psychiatric/Behavioral: Negative for dysphoric mood. The patient is not nervous/anxious.        Objective:   Physical Exam Well-developed female in no acute distress Nose without purulence or discharge noted Chest fairly clear to auscultation, no crackles or wheezes Cardiac exam with regular rate and rhythm Lower extremities without edema, no cyanosis noted Alert and oriented, moves all 4 extremities.         Assessment & Plan:

## 2011-02-06 NOTE — Patient Instructions (Signed)
Will give you a prescription for prednisone to hold.   If your breathing gets worse, please fill and start taking Finish antibiotic No change in asthma meds followup with Dr. Delford Field.

## 2011-02-06 NOTE — Telephone Encounter (Signed)
ATC NA and mailbox is full [facility]

## 2011-02-08 ENCOUNTER — Telehealth: Payer: Self-pay | Admitting: Critical Care Medicine

## 2011-02-08 NOTE — Telephone Encounter (Signed)
Spoke with pt and she states that her insurance co called her and advised that she will not need PA done until June 2013. I advised that we will take care of it then, and she should call in the meantime if needed. She verbalized understanding and states nothing further needed.

## 2011-02-08 NOTE — Telephone Encounter (Signed)
Pt returned call.  Pt aware that she is difficult to reach, but requests we keep trying.  [name]

## 2011-02-08 NOTE — Telephone Encounter (Signed)
Spoke with pt and she states that her insurance co called her and advised that she will not need PA done until June 2013. I advised that we will take care of it then, and she should call in the meantime if needed. She verbalized understanding and states nothing further needed.  

## 2011-02-08 NOTE — Telephone Encounter (Signed)
ATC NA and no option to leave a msg, WCB.  

## 2011-02-08 NOTE — Telephone Encounter (Signed)
Spoke with pt. She states that she is needing PA on singulair and she is going to pharmacy now to leave her ins information- she states that the pharmacist called her and advised they needed this before they can fax [place] the PA request. Will await the fax.

## 2011-05-15 ENCOUNTER — Other Ambulatory Visit: Payer: Self-pay | Admitting: Critical Care Medicine

## 2011-05-15 ENCOUNTER — Telehealth: Payer: Self-pay | Admitting: Critical Care Medicine

## 2011-05-15 NOTE — Telephone Encounter (Signed)
[facility] [place]. Pt aware.

## 2011-06-05 ENCOUNTER — Encounter: Payer: Self-pay | Admitting: Gynecology

## 2011-06-13 ENCOUNTER — Encounter: Payer: Self-pay | Admitting: Obstetrics and Gynecology

## 2011-06-14 ENCOUNTER — Ambulatory Visit (INDEPENDENT_AMBULATORY_CARE_PROVIDER_SITE_OTHER): Payer: Medicare Other | Admitting: Obstetrics and Gynecology

## 2011-06-14 ENCOUNTER — Encounter: Payer: Self-pay | Admitting: Obstetrics and Gynecology

## 2011-06-14 VITALS — BP 124/76 | Ht 63.0 in | Wt 178.0 lb

## 2011-06-14 DIAGNOSIS — N390 Urinary tract infection, site not specified: Secondary | ICD-10-CM

## 2011-06-14 DIAGNOSIS — N816 Rectocele: Secondary | ICD-10-CM

## 2011-06-14 DIAGNOSIS — K219 Gastro-esophageal reflux disease without esophagitis: Secondary | ICD-10-CM | POA: Insufficient documentation

## 2011-06-14 DIAGNOSIS — B373 Candidiasis of vulva and vagina: Secondary | ICD-10-CM

## 2011-06-14 DIAGNOSIS — M899 Disorder of bone, unspecified: Secondary | ICD-10-CM

## 2011-06-14 DIAGNOSIS — M8580 Other specified disorders of bone density and structure, unspecified site: Secondary | ICD-10-CM

## 2011-06-14 DIAGNOSIS — B3731 Acute candidiasis of vulva and vagina: Secondary | ICD-10-CM

## 2011-06-14 NOTE — Progress Notes (Signed)
Patient came to see me today for further followup. She has had a chronic problem with yeast infections but she is currently asymptomatic and wanted to be sure all was normal. She has atrophic vaginitis but is currently not sexually active and does not need treatment. She does have some urinary urgency and frequency if she drinks too much fluid. She has no dysuria or incontinence. She has a small rectocele but is not  having trouble eliminating stool. She has osteopenia without an elevated FRAX risk. She has had no fractures and take her calcium and vitamin D. She is having no vaginal bleeding. She is having no pelvic pain.  ROS: 12 system review done. Pertinent positives above. Other positives include hyperlipidemia, rhinitis, GERD, colon polyps, and asthma.  Physical examination: kim Julian Reil present. HEENT within normal limits. Neck: Thyroid not large. No masses. Supraclavicular nodes: not enlarged. Breasts: Examined in both sitting and lying  position. No skin changes and no masses. Abdomen: Soft no guarding rebound or masses or hernia. Pelvic: External: Within normal limits. BUS: Within normal limits. Vaginal:within normal limits. Poor estrogen effect. No evidence of cystocele  or enterocele. Small rectocele. Cervix: clean. Uterus: Normal size and shape. Adnexa: No masses. Rectovaginal exam: Confirmatory and negative except for hemorrhoidal tags. Extremities: Within normal limits.  Assessment: #1. Recurrent yeast vaginitis #2. Atrophic vaginitis #3. Rectocele #4. Urinary urgency #5. Osteopenia  Plan: Continue yearly mammograms. Bone density next year. Call when necessary need for treatment for the above.

## 2011-06-15 LAB — URINALYSIS W MICROSCOPIC + REFLEX CULTURE
Bilirubin Urine: NEGATIVE
Glucose, UA: NEGATIVE mg/dL
Hgb urine dipstick: NEGATIVE
Ketones, ur: NEGATIVE mg/dL
Protein, ur: NEGATIVE mg/dL
Squamous Epithelial / LPF: NONE SEEN
Urobilinogen, UA: 0.2 mg/dL (ref 0.0–1.0)

## 2011-06-17 LAB — URINE CULTURE

## 2011-06-19 ENCOUNTER — Other Ambulatory Visit: Payer: Self-pay | Admitting: *Deleted

## 2011-06-19 DIAGNOSIS — N390 Urinary tract infection, site not specified: Secondary | ICD-10-CM

## 2011-06-19 MED ORDER — NITROFURANTOIN MONOHYD MACRO 100 MG PO CAPS: 100.0000 mg | ORAL_CAPSULE | Freq: Two times a day (BID) | ORAL | Status: AC

## 2011-06-23 ENCOUNTER — Telehealth: Payer: Self-pay | Admitting: Gynecology

## 2011-06-23 NOTE — Telephone Encounter (Signed)
On Call note.  Patient has taken 3 days of Macrobid for asymptomatic bacturia.  Complaining of generalized muscle soreness and upper back pain.  No fevers or UTI symptoms.  Recommended stopping antibiotic and rechecking CC UA next week.  Follow up if above symptoms persist.

## 2011-07-16 ENCOUNTER — Other Ambulatory Visit: Payer: Self-pay | Admitting: Critical Care Medicine

## 2011-07-17 ENCOUNTER — Telehealth: Payer: Self-pay | Admitting: Critical Care Medicine

## 2011-07-17 MED ORDER — FLUTICASONE FUROATE 27.5 MCG/SPRAY NA SUSP: 2.0000 | Freq: Every day | NASAL | Status: DC

## 2011-07-17 NOTE — Telephone Encounter (Signed)
Patient last seen by PEW 10.11.12, seen twice acutely by TP and KC since.  Next ov with PEW 7.9.13.  Called spoke with patient, verified that she is [facility] [place]  Advised pt will send refills but she needs to keep her ov with PEW in July.  Pt verbalized her understanding.

## 2011-07-17 NOTE — Telephone Encounter (Signed)
Has already been taken care of per phone msg from 07/17/11

## 2011-07-18 ENCOUNTER — Telehealth: Payer: Self-pay | Admitting: *Deleted

## 2011-07-18 MED ORDER — NONFORMULARY OR COMPOUNDED ITEM: Status: DC

## 2011-07-18 NOTE — Telephone Encounter (Signed)
yes

## 2011-07-18 NOTE — Telephone Encounter (Signed)
OK to give her that. Is there any way you can tell from the chart or  call custom care to see what we gave her?

## 2011-07-18 NOTE — Telephone Encounter (Signed)
She got rx for [place] boric acid 600 mg tablets 1 capsule vaginally at bedtime x 2 week then twice per week #30. Do you still want pt to take directions as above?

## 2011-07-18 NOTE — Telephone Encounter (Signed)
Addended by: Aura Camps on: 07/18/2011 11:50 AM   Modules accepted: Orders

## 2011-07-18 NOTE — Telephone Encounter (Signed)
Pt informed with the below note, rx called in to pharmacy. 

## 2011-07-18 NOTE — Telephone Encounter (Signed)
Pt c/o vaginal burning, no discharge, very little itching. She was given Macrobid x 7 days on 06/19/11 took all rx and she noticed this week the above symptoms. Pt didn't return for f/u u/a because she saw her PCP that following week after her annual appointment. She believes this could be yeast? Has history of recurrent yeast, pt said you wrote for rx for custom care for yeast that worked great. Please advise

## 2011-08-21 ENCOUNTER — Encounter: Payer: Self-pay | Admitting: Critical Care Medicine

## 2011-08-21 ENCOUNTER — Ambulatory Visit (INDEPENDENT_AMBULATORY_CARE_PROVIDER_SITE_OTHER): Payer: Medicare Other | Admitting: Critical Care Medicine

## 2011-08-21 VITALS — BP 120/80 | HR 80 | Temp 98.3°F | Ht 63.5 in | Wt 174.4 lb

## 2011-08-21 DIAGNOSIS — J45909 Unspecified asthma, uncomplicated: Secondary | ICD-10-CM

## 2011-08-21 MED ORDER — ALBUTEROL SULFATE HFA 108 (90 BASE) MCG/ACT IN AERS: 2.0000 | INHALATION_SPRAY | Freq: Four times a day (QID) | RESPIRATORY_TRACT | Status: DC | PRN

## 2011-08-21 MED ORDER — AZELASTINE HCL 0.15 % NA SOLN: 2.0000 | Freq: Every day | NASAL | Status: DC

## 2011-08-21 MED ORDER — MONTELUKAST SODIUM 10 MG PO TABS: 10.0000 mg | ORAL_TABLET | Freq: Every day | ORAL | Status: DC

## 2011-08-21 MED ORDER — LEVOCETIRIZINE DIHYDROCHLORIDE 5 MG PO TABS: 5.0000 mg | ORAL_TABLET | Freq: Every evening | ORAL | Status: DC

## 2011-08-21 NOTE — Progress Notes (Signed)
Subjective:    Patient ID: [name], female    DOB: 08-01-1945, 66 y.o.   MRN: [id number]  HPI  66 y.o. wf never smoker with history of moderate persistent asthma. . The patient had significant allergic rhinitis with postnasal drip syndrome and reflux disease aggravating a cyclic cough.   08/21/2011 Since 12/12 doing ok. Pt notes nasal itching.  No real cough.  No wheeze or dyspnea. [place] dyspnea. Pt denies any significant sore throat, nasal congestion or excess secretions, fever, chills, sweats, unintended weight loss, pleurtic or exertional chest pain, orthopnea PND, or leg swelling Pt denies any increase in rescue therapy over baseline, denies waking up needing it or having any early am or nocturnal exacerbations of coughing/wheezing/or dyspnea. Pt also denies any obvious fluctuation in symptoms with  weather or environmental change or other alleviating or aggravating factors    Past Medical History  Diagnosis Date  . Esophageal reflux   . Other acute sinusitis   . Other and unspecified hyperlipidemia   . Unspecified asthma   . Allergic rhinitis, cause unspecified   . Depression   . Chronic bronchitis   . IBS (irritable bowel syndrome)   . Diverticulosis   . Anxiety disorder   . Hx of adenomatous colonic polyps   . GERD (gastroesophageal reflux disease)   . Toenail fungus      Family History  Problem Relation Age of Onset  . Colon cancer Father   . Lung cancer Father   . Heart attack Father   . Stomach cancer Father   . Hypertension Father   . Cancer Father     Bladder cancer  . Hypertension Mother   . Osteoporosis Mother   . Stroke Mother   . Liver disease Maternal Grandmother   . Colon polyps Brother   . Prostate cancer Maternal Uncle   . Breast cancer Paternal Aunt     Age 11's  . Breast cancer Paternal Grandmother     Age 61's  . Diabetes Paternal Grandmother   . Osteoporosis Maternal Aunt   . Lung cancer Paternal Grandfather      History   Social History  . Marital Status: Divorced    Spouse Name: N/A    Number of Children: N/A  . Years of Education: N/A   Occupational History  . retired Runner, broadcasting/film[employer]video    Social History Main Topics  . Smoking status: Never Smoker   . Smokeless tobacco: Never Used  . Alcohol Use: Yes     rare  . Drug Use: Not on file  . Sexually Active: No   Other Topics Concern  . Not on file   Social History Narrative  . No narrative on file     Allergies  Allergen Reactions  . Naproxen     Ulcers per pt  . Sulfonamide Derivatives      Outpatient Prescriptions Prior to Visit  Medication Sig Dispense Refill  . aspirin 81 MG tablet Take 81 mg by mouth every other day.        . calcium citrate-vitamin D 200-200 MG-UNIT TABS Take 2 tablets by mouth daily.        . chlorpheniramine (CHLOR-TRIMETON) 4 MG tablet Take 4 mg by mouth daily.        . fish oil-omega-3 fatty acids 1000 MG capsule Take 1 g by mouth every other day.        . fluticasone (VERAMYST) 27.5 MCG/SPRAY nasal spray Place 2 sprays into the  nose daily.  10 g  1  . Multiple Vitamin (MULTIVITAMIN) tablet Take 1 tablet by mouth daily.        . ranitidine (ZANTAC) 150 MG capsule Take 150 mg by mouth 2 (two) times daily.        Marland Kitchen albuterol (PROAIR HFA) 108 (90 BASE) MCG/ACT inhaler Inhale 2 puffs into the lungs every 6 (six) hours as needed.  1 Inhaler  6  . ASTEPRO 0.15 % SOLN USE 2 SPRAYS IN EACH NOSTRIL ONCE DAILY  30 mL  3  . cetirizine (ZYRTEC) 10 MG tablet Take 10 mg by mouth daily.        . montelukast (SINGULAIR) 10 MG tablet Take 1 tablet (10 mg total) by mouth at bedtime.  30 tablet  6  . atorvastatin (LIPITOR) 40 MG tablet Take 20 mg by mouth daily. Take 1/2 once daily.       . NONFORMULARY OR COMPOUNDED ITEM boric acid 600 mg tablets 1 capsule vaginally at bedtime x 2 week then twice per week  30 each  0  . predniSONE (STERAPRED UNI-PAK) 10 MG tablet Take 4 tabs daily  For 2 days, then take 3 tabs daily  for 2  days, then take 2 tabs daily  For 2 days, and then stop.  18 tablet  0      Review of Systems  Constitutional:   No  weight loss, night sweats,  Fevers, chills, fatigue, lassitude. HEENT:   No headaches,  Difficulty swallowing,  Tooth/dental problems,  Sore throat,   ++ nasal congestion, post nasal drip,   CV:  No chest pain,  Orthopnea, PND, swelling in lower extremities, anasarca, dizziness, palpitations  GI  Notes  Heartburn, no  indigestion, abdominal pain, nausea, vomiting, diarrhea, change in bowel habits, loss of appetite  Resp:    No coughing up of blood.    No chest wall deformity  Skin: no rash or lesions.  GU: no dysuria, change in color of urine, no urgency or frequency.  No flank pain.  MS:  No joint pain or swelling.  No decreased range of motion.  No back pain.  Psych:  No change in mood or affect. No depression or anxiety.  No memory loss.     Objective:   Physical Exam  Filed Vitals:   08/21/11 1541  BP: 120/80  Pulse: 80  Temp: 98.3 F (36.8 C)  TempSrc: Oral  Height: 5' 3.5" (1.613 m)  Weight: 174 lb 6.4 oz (79.107 kg)  SpO2: 94%    Gen: Pleasant, well-nourished, in no distress,  normal affect  ENT: No lesions,  mouth clear,  oropharynx clear,    Neck: No JVD, no TMG, no carotid bruits  Lungs: No use of accessory muscles, no dullness to percussion, coarse BS w/ no wheezing   Cardiovascular: RRR, heart sounds normal, no murmur or gallops, no peripheral edema  Abdomen: soft and NT, no HSM,  BS normal  Musculoskeletal: No deformities, no cyanosis or clubbing  Neuro: alert, non focal  Skin: Warm, no lesions or rashes       Assessment & Plan:   ASTHMA Moderate persistent asthma with recent exacerbation now improved Plan Maintain inhaled medications as prescribed Followup Xyzal in place of Zyrtec Return 12 months      Updated Medication List Outpatient Encounter Prescriptions as of 08/21/2011  Medication Sig Dispense Refill  .  albuterol (PROAIR HFA) 108 (90 BASE) MCG/ACT inhaler Inhale 2 puffs into the lungs every 6 (six)  hours as needed.  1 Inhaler  6  . aspirin 81 MG tablet Take 81 mg by mouth every other day.        . Azelastine HCl (ASTEPRO) 0.15 % SOLN Place 2 puffs into the nose daily.  30 mL  11  . calcium citrate-vitamin D 200-200 MG-UNIT TABS Take 2 tablets by mouth daily.        . chlorpheniramine (CHLOR-TRIMETON) 4 MG tablet Take 4 mg by mouth daily.        . fish oil-omega-3 fatty acids 1000 MG capsule Take 1 g by mouth every other day.        . fluticasone (VERAMYST) 27.5 MCG/SPRAY nasal spray Place 2 sprays into the nose daily.  10 g  1  . montelukast (SINGULAIR) 10 MG tablet Take 1 tablet (10 mg total) by mouth at bedtime.  30 tablet  11  . Multiple Vitamin (MULTIVITAMIN) tablet Take 1 tablet by mouth daily.        . ranitidine (ZANTAC) 150 MG capsule Take 150 mg by mouth 2 (two) times daily.        Marland Kitchen DISCONTD: albuterol (PROAIR HFA) 108 (90 BASE) MCG/ACT inhaler Inhale 2 puffs into the lungs every 6 (six) hours as needed.  1 Inhaler  6  . DISCONTD: ASTEPRO 0.15 % SOLN USE 2 SPRAYS IN EACH NOSTRIL ONCE DAILY  30 mL  3  . DISCONTD: cetirizine (ZYRTEC) 10 MG tablet Take 10 mg by mouth daily.        Marland Kitchen DISCONTD: montelukast (SINGULAIR) 10 MG tablet Take 1 tablet (10 mg total) by mouth at bedtime.  30 tablet  6  . levocetirizine (XYZAL) 5 MG tablet Take 1 tablet (5 mg total) by mouth every evening.  30 tablet  6  . DISCONTD: atorvastatin (LIPITOR) 40 MG tablet Take 20 mg by mouth daily. Take 1/2 once daily.       Marland Kitchen DISCONTD: NONFORMULARY OR COMPOUNDED ITEM boric acid 600 mg tablets 1 capsule vaginally at bedtime x 2 week then twice per week  30 each  0  . DISCONTD: predniSONE (STERAPRED UNI-PAK) 10 MG tablet Take 4 tabs daily  For 2 days, then take 3 tabs daily  for 2 days, then take 2 tabs daily  For 2 days, and then stop.  18 tablet  0

## 2011-08-21 NOTE — Patient Instructions (Addendum)
Try xyzal one daily in place of zyrtec No other medication changes Return 1 year

## 2011-08-22 ENCOUNTER — Other Ambulatory Visit: Payer: Self-pay | Admitting: Critical Care Medicine

## 2011-08-22 NOTE — Assessment & Plan Note (Addendum)
Moderate persistent asthma with recent exacerbation now improved Plan Maintain inhaled medications as prescribed Followup Xyzal in place of Zyrtec Return 12 months

## 2011-08-22 NOTE — Telephone Encounter (Signed)
Singulair rx was sent during OV yesterday.  [facility] and was [facility] did [facility] the [facility]air rx from yesterday.

## 2011-08-22 NOTE — Telephone Encounter (Signed)
Pt called.  Needs refill for Montelukast 10 mg.  Would like a 6 month supply.  [facility] [address] [place]

## 2011-08-27 ENCOUNTER — Ambulatory Visit: Payer: Medicare Other | Admitting: Critical Care Medicine

## 2011-08-28 ENCOUNTER — Ambulatory Visit: Payer: Medicare Other | Admitting: Critical Care Medicine

## 2011-09-24 DIAGNOSIS — C44320 Squamous cell carcinoma of skin of unspecified parts of face: Secondary | ICD-10-CM | POA: Insufficient documentation

## 2011-11-16 ENCOUNTER — Ambulatory Visit: Payer: Medicare Other | Admitting: Critical Care Medicine

## 2011-12-24 ENCOUNTER — Other Ambulatory Visit: Payer: Self-pay | Admitting: Critical Care Medicine

## 2012-03-18 ENCOUNTER — Other Ambulatory Visit: Payer: Self-pay | Admitting: Critical Care Medicine

## 2012-03-31 ENCOUNTER — Ambulatory Visit (INDEPENDENT_AMBULATORY_CARE_PROVIDER_SITE_OTHER): Payer: Medicare Other | Admitting: Gynecology

## 2012-03-31 ENCOUNTER — Encounter: Payer: Self-pay | Admitting: Gynecology

## 2012-03-31 DIAGNOSIS — B9689 Other specified bacterial agents as the cause of diseases classified elsewhere: Secondary | ICD-10-CM

## 2012-03-31 DIAGNOSIS — N949 Unspecified condition associated with female genital organs and menstrual cycle: Secondary | ICD-10-CM

## 2012-03-31 DIAGNOSIS — N898 Other specified noninflammatory disorders of vagina: Secondary | ICD-10-CM

## 2012-03-31 DIAGNOSIS — N760 Acute vaginitis: Secondary | ICD-10-CM

## 2012-03-31 DIAGNOSIS — A499 Bacterial infection, unspecified: Secondary | ICD-10-CM

## 2012-03-31 LAB — WET PREP FOR TRICH, YEAST, CLUE: Clue Cells Wet Prep HPF POC: NONE SEEN

## 2012-03-31 MED ORDER — CLINDAMYCIN PHOSPHATE 2 % VA CREA: 1.0000 | TOPICAL_CREAM | Freq: Every day | VAGINAL | Status: DC

## 2012-03-31 NOTE — Progress Notes (Signed)
Patient presents complaining of vaginal odor. She had an episode last month and treated herself with Monistat. It recurred this past weekend and she tried Monistat again but the odor has continued. Also some slight itching. No significant discharge. No urinary symptoms.  Exam with Selena Batten assistant External BUS vagina with atrophic changes, slight white discharge. Rectocele noted.  Cervix grossly normal. Uterus normal size midline mobile nontender. Adnexa without masses or tenderness.  Assessment and plan: 1. Vaginal odor. Wet prep consistent with low grade bacterial vaginosis. We'll treat with Cleocin vaginal cream nightly x7 days at her preference. Follow up if symptoms persist or recur. 2. Rectocele. Stable from prior exams by Dr. Eda Paschal. Follow up at annual exam scheduled April 2014.

## 2012-03-31 NOTE — Patient Instructions (Signed)
Use vaginal cream nightly for 7 days. Follow up if your symptoms persist, worsen or return. Otherwise follow up in April when you're due for your annual exam.

## 2012-05-08 ENCOUNTER — Telehealth: Payer: Self-pay | Admitting: Critical Care Medicine

## 2012-05-08 NOTE — Telephone Encounter (Signed)
lmtcb x1 KimT  

## 2012-05-09 NOTE — Telephone Encounter (Signed)
I spoke with the pt and she states that she has a cat and wanted to know if the cat would effect her long term as far as her allergies goes. I advised that I am not aware of a cat affecting her health longterm as far as her allergies. The pt states she is willing to treat herself with antihistamines while she is around the cat but does not want to give the cat away. She states she wants to continue her medication regimen and see how it goes. Carron Curie, CMA

## 2012-06-17 ENCOUNTER — Encounter: Payer: Medicare Other | Admitting: Gynecology

## 2012-06-26 ENCOUNTER — Encounter: Payer: Self-pay | Admitting: Gynecology

## 2012-06-26 ENCOUNTER — Encounter: Payer: Self-pay | Admitting: Obstetrics and Gynecology

## 2012-06-26 ENCOUNTER — Ambulatory Visit (INDEPENDENT_AMBULATORY_CARE_PROVIDER_SITE_OTHER): Payer: Medicare Other | Admitting: Gynecology

## 2012-06-26 VITALS — BP 124/80 | Ht 63.0 in | Wt 172.0 lb

## 2012-06-26 DIAGNOSIS — M8580 Other specified disorders of bone density and structure, unspecified site: Secondary | ICD-10-CM

## 2012-06-26 DIAGNOSIS — M899 Disorder of bone, unspecified: Secondary | ICD-10-CM

## 2012-06-26 DIAGNOSIS — L292 Pruritus vulvae: Secondary | ICD-10-CM

## 2012-06-26 DIAGNOSIS — L293 Anogenital pruritus, unspecified: Secondary | ICD-10-CM

## 2012-06-26 DIAGNOSIS — N952 Postmenopausal atrophic vaginitis: Secondary | ICD-10-CM

## 2012-06-26 DIAGNOSIS — M949 Disorder of cartilage, unspecified: Secondary | ICD-10-CM

## 2012-06-26 LAB — URINALYSIS W MICROSCOPIC + REFLEX CULTURE
Bacteria, UA: NONE SEEN
Casts: NONE SEEN
Crystals: NONE SEEN
Ketones, ur: NEGATIVE mg/dL
Nitrite: NEGATIVE
Specific Gravity, Urine: 1.01 (ref 1.005–1.030)
Urobilinogen, UA: 0.2 mg/dL (ref 0.0–1.0)
pH: 7 (ref 5.0–8.0)

## 2012-06-26 NOTE — Progress Notes (Signed)
[name] [birth year] [id number]        67 y.o.  [id number] for followup exam.   Former patient of Dr. Eda Paschal. Several issues noted below.  Past medical history,surgical history, medications, allergies, family history and social history were all reviewed and documented in the EPIC chart. ROS:  Was performed and pertinent positives and negatives are included in the history.  Exam: [name] Filed Vitals:   06/26/12 0914  BP: 124/80  Height: 5\' 3"  (1.6 m)  Weight: 172 lb (78.019 kg)   General appearance  Normal Skin grossly normal Head/Neck normal with no cervical or supraclavicular adenopathy thyroid normal Lungs  clear Cardiac RR, without RMG Abdominal  soft, nontender, without masses, organomegaly or hernia Breasts  examined lying and sitting without masses, retractions, discharge or axillary adenopathy. Pelvic  Ext/BUS/vagina  normal with atrophic changes  Cervix  normal with atrophic changes  Uterus  axial, normal size, shape and contour, midline and mobile nontender   Adnexa  Without masses or tenderness    Anus and perineum  normal   Rectovaginal  normal sphincter tone without palpated masses or tenderness.    Assessment/Plan:  D[age] y.o. [name] female for followup exam.   1. Vaginal dryness/atrophic vaginitis. Patient having intermittent vaginal irritation. Has a history of yeast in the past although exam today appears normal and I did not do a wet prep. Will arch or the treat with Diflucan 150x1 dose. I discussed atrophic vaginitis and recommended trial of OTC moisturizer. Possible estrogen such as  estrogencream/Vagifem/Osphena reviewed. Patient will followup if continues to be a problem she wants to rediscuss more aggressive treatment. 2. Osteopenia. DEXA 07/2010 with T score -1.1. FRAX 7.7%/0.5%. Increase calcium vitamin D reviewed. Plan repeat DEXA in another 1-2 years. 3. Mammography today. Continue with annual mammography. SBE monthly reviewed. 4. Pap smear 2012. The  Pap smear done today. No history of abnormal Pap smears. Patient is over 46 and the options to stop screening altogether or less frequent screening reviewed. Plan repeat Pap smear next year 3 year interval and then we'll rediscuss followup. 5. Colonoscopy listed as 06 but she relates having it done in [place] since then approximately 5 years ago. Does have paternal history of colon cancer and I recommended she call her gastroenterologist in followup now 5 years for repeat. 6. Health maintenance. No blood work done as it is all done through her primary physician's office. Followup one year, sooner as needed.    [name], 9:49 AM 06/26/2012

## 2012-06-26 NOTE — Patient Instructions (Addendum)
Try Replens for vaginal dryness  Follow up in one year for annual exam

## 2012-06-27 ENCOUNTER — Other Ambulatory Visit: Payer: Self-pay | Admitting: Gynecology

## 2012-06-27 ENCOUNTER — Encounter: Payer: Self-pay | Admitting: Gynecology

## 2012-06-27 MED ORDER — CIPROFLOXACIN HCL 250 MG PO TABS: 250.0000 mg | ORAL_TABLET | Freq: Two times a day (BID) | ORAL | Status: DC

## 2012-06-28 ENCOUNTER — Telehealth: Payer: Self-pay | Admitting: Obstetrics and Gynecology

## 2012-06-28 NOTE — Telephone Encounter (Signed)
Phone call returned to patient, who contacted the answering service.  Patient with questions regarding ciprofloxacin for current UTI.  Read precautions on Rx regarding patients over 60 with bursitis.  Patient states she has a sore upper extremity for the last month but no bursitis and reports her age to be 3.  Took one dose already and has used in the past without difficulty.    E Coli 45,000 colonies noted on last UC result.  No sensitivities reported yet.   I told patient that the benefit of finishing the Rx outweighed the risk to her of tendinitis or rupture, which is a very rare complication of Cipro use.

## 2012-07-08 ENCOUNTER — Other Ambulatory Visit: Payer: Self-pay | Admitting: Gynecology

## 2012-07-08 ENCOUNTER — Other Ambulatory Visit: Payer: Medicare Other

## 2012-07-08 ENCOUNTER — Telehealth: Payer: Self-pay | Admitting: *Deleted

## 2012-07-08 DIAGNOSIS — R350 Frequency of micturition: Secondary | ICD-10-CM

## 2012-07-08 NOTE — Telephone Encounter (Signed)
Please have patient come in for follow up u/a

## 2012-07-08 NOTE — Telephone Encounter (Signed)
Pt was treated by Dr Audie Box with Cipro for a UTI that grew out [facility] on a culture. Took the Rx 5/9-5/12. Now c/o slight urine frequency. She is requesting a follow up urine culture to see if infection still there. Please advise if ok

## 2012-07-08 NOTE — Telephone Encounter (Signed)
Pt informed and will come to office today. Order placed

## 2012-07-09 LAB — URINALYSIS W MICROSCOPIC + REFLEX CULTURE
Bacteria, UA: NONE SEEN
Bilirubin Urine: NEGATIVE
Casts: NONE SEEN
Crystals: NONE SEEN
Ketones, ur: NEGATIVE mg/dL
Nitrite: NEGATIVE
Specific Gravity, Urine: 1.015 (ref 1.005–1.030)
Squamous Epithelial / LPF: NONE SEEN
Urobilinogen, UA: 0.2 mg/dL (ref 0.0–1.0)
pH: 8 (ref 5.0–8.0)

## 2012-07-22 ENCOUNTER — Other Ambulatory Visit: Payer: Self-pay | Admitting: Critical Care Medicine

## 2012-07-23 NOTE — Telephone Encounter (Signed)
Last OV with PW 08/21/2011; asked to f/u in 1 yr Has a pending OV with PW on 08/27/2012. Rx sent to last until OV.

## 2012-08-27 ENCOUNTER — Ambulatory Visit (INDEPENDENT_AMBULATORY_CARE_PROVIDER_SITE_OTHER): Payer: Medicare Other | Admitting: Critical Care Medicine

## 2012-08-27 ENCOUNTER — Other Ambulatory Visit: Payer: Self-pay | Admitting: Critical Care Medicine

## 2012-08-27 ENCOUNTER — Encounter: Payer: Self-pay | Admitting: Critical Care Medicine

## 2012-08-27 VITALS — BP 110/70 | HR 72 | Temp 98.4°F | Ht 63.5 in

## 2012-08-27 DIAGNOSIS — J45909 Unspecified asthma, uncomplicated: Secondary | ICD-10-CM

## 2012-08-27 DIAGNOSIS — J4530 Mild persistent asthma, uncomplicated: Secondary | ICD-10-CM

## 2012-08-27 MED ORDER — MONTELUKAST SODIUM 10 MG PO TABS: ORAL_TABLET | ORAL | Status: DC

## 2012-08-27 MED ORDER — CHLORPHENIRAMINE MALEATE 4 MG PO TABS: 8.0000 mg | ORAL_TABLET | Freq: Every evening | ORAL | Status: DC | PRN

## 2012-08-27 MED ORDER — FLUTICASONE FUROATE 27.5 MCG/SPRAY NA SUSP: NASAL | Status: DC

## 2012-08-27 MED ORDER — AZELASTINE HCL 0.15 % NA SOLN: 2.0000 | Freq: Every day | NASAL | Status: DC

## 2012-08-27 MED ORDER — LEVOCETIRIZINE DIHYDROCHLORIDE 5 MG PO TABS: 5.0000 mg | ORAL_TABLET | Freq: Every evening | ORAL | Status: DC

## 2012-08-27 MED ORDER — OLOPATADINE HCL 0.1 % OP SOLN: 1.0000 [drp] | Freq: Two times a day (BID) | OPHTHALMIC | Status: DC

## 2012-08-27 NOTE — Telephone Encounter (Signed)
Last OV with PW 08/21/11 Pending OV with PW 08/27/12 Rx sent x 1

## 2012-08-27 NOTE — Progress Notes (Signed)
Subjective:    Patient ID: [name], female    DOB: August 28, 1945, 67 y.o.   MRN: [id number]  HPI  67 y.o. wf never smoker with history of moderate persistent asthma. . The patient had significant allergic rhinitis with postnasal drip syndrome and reflux disease aggravating a cyclic cough.    08/27/2012 Chief Complaint  Patient presents with  . Yearly Follow up    Recently adopted cat.  Has noticed clear drainage from eyes, itching in nose and eys, sneezing, eye swelling, wheezing and chest tightness from "time to time" since having cat.  No SOB.  Since got a cat got more sinus issues , sneezing, eyes water and swell.  Pt denies any significant sore throat, nasal congestion or excess secretions, fever, chills, sweats, unintended weight loss, pleurtic or exertional chest pain, orthopnea PND, or leg swelling Pt denies any increase in rescue therapy over baseline, denies waking up needing it or having any early am or nocturnal exacerbations of coughing/wheezing/or dyspnea. Pt also denies any obvious fluctuation in symptoms with  weather or environmental change or other alleviating or aggravating factors      Past Medical History  Diagnosis Date  . Esophageal reflux   . Other acute sinusitis   . Other and unspecified hyperlipidemia   . Unspecified asthma(493.90)   . Allergic rhinitis, cause unspecified   . Depression   . Chronic bronchitis   . IBS (irritable bowel syndrome)   . Diverticulosis   . Anxiety disorder   . Hx of adenomatous colonic polyps   . GERD (gastroesophageal reflux disease)   . Toenail fungus      Family History  Problem Relation Age of Onset  . Colon cancer Father   . Lung cancer Father   . Heart attack Father   . Stomach cancer Father   . Hypertension Father   . Cancer Father     Bladder cancer  . Hypertension Mother   . Osteoporosis Mother   . Stroke Mother   . Liver disease Maternal Grandmother   . Colon polyps Brother   . Prostate cancer Maternal  Uncle   . Breast cancer Paternal Aunt     Age 63's  . Breast cancer Paternal Grandmother     Age 37's  . Diabetes Paternal Grandmother   . Osteoporosis Maternal Aunt   . Lung cancer Paternal Grandfather      History   Social History  . Marital Status: Divorced    Spouse Name: N/A    Number of Children: N/A  . Years of Education: N/A   Occupational History  . retired Runner, broadcasting/film[employer]video    Social History Main Topics  . Smoking status: Never Smoker   . Smokeless tobacco: Never Used  . Alcohol Use: Yes     Comment: rare  . Drug Use: Not on file  . Sexually Active: No   Other Topics Concern  . Not on file   Social History Narrative  . No narrative on file     Allergies  Allergen Reactions  . Latex Hives  . Naproxen     Ulcers per pt  . Sulfonamide Derivatives      Outpatient Prescriptions Prior to Visit  Medication Sig Dispense Refill  . albuterol (PROAIR HFA) 108 (90 BASE) MCG/ACT inhaler Inhale 2 puffs into the lungs every 6 (six) hours as needed.  1 Inhaler  6  . aspirin 81 MG tablet Take 81 mg by mouth every other day.        Marland Kitchen  atorvastatin (LIPITOR) 20 MG tablet Take 20 mg by mouth daily.      . calcium citrate-vitamin D 200-200 MG-UNIT TABS Take 2 tablets by mouth daily.        . fish oil-omega-3 fatty acids 1000 MG capsule Take 1 g by mouth every other day.        . Multiple Vitamin (MULTIVITAMIN) tablet Take 1 tablet by mouth daily.        . ranitidine (ZANTAC) 150 MG capsule Take 150 mg by mouth 2 (two) times daily.        . Azelastine HCl (ASTEPRO) 0.15 % SOLN Place 2 puffs into the nose daily.  30 mL  11  . chlorpheniramine (CHLOR-TRIMETON) 4 MG tablet Take 4 mg by mouth at bedtime as needed.       . fluticasone (VERAMYST) 27.5 MCG/SPRAY nasal spray Place 2 sprays into the nose daily.  10 g  1  . VERAMYST 27.5 MCG/SPRAY nasal spray PLACE 2 SPRAYS INTO THE NOSE DAILY.  10 g  1  . ciprofloxacin (CIPRO) 250 MG tablet Take 1 tablet (250 mg total) by mouth 2 (two) times  daily.  6 tablet  0  . montelukast (SINGULAIR) 10 MG tablet Take 1 tablet (10 mg total) by mouth at bedtime.  30 tablet  11   No facility-administered medications prior to visit.      Review of Systems  Constitutional:   No  weight loss, night sweats,  Fevers, chills, fatigue, lassitude. HEENT:   No headaches,  Difficulty swallowing,  Tooth/dental problems,  Sore throat,   ++ nasal congestion, ++post nasal drip, eyes irritated   CV:  No chest pain,  Orthopnea, PND, swelling in lower extremities, anasarca, dizziness, palpitations  GI  Notes  Heartburn, no  indigestion, abdominal pain, nausea, vomiting, diarrhea, change in bowel habits, loss of appetite  Resp:    No coughing up of blood.    No chest wall deformity  Skin: no rash or lesions.  GU: no dysuria, change in color of urine, no urgency or frequency.  No flank pain.  MS:  No joint pain or swelling.  No decreased range of motion.  No back pain.  Psych:  No change in mood or affect. No depression or anxiety.  No memory loss.     Objective:   Physical Exam  Filed Vitals:   08/27/12 1105  BP: 110/70  Pulse: 72  Temp: 98.4 F (36.9 C)  TempSrc: Oral  Height: 5' 3.5" (1.613 m)  SpO2: 95%    Gen: Pleasant, well-nourished, in no distress,  normal affect  ENT: No lesions,  mouth clear,  oropharynx clear,    Neck: No JVD, no TMG, no carotid bruits  Lungs: No use of accessory muscles, no dullness to percussion, coarse BS w/ no wheezing   Cardiovascular: RRR, heart sounds normal, no murmur or gallops, no peripheral edema  Abdomen: soft and NT, no HSM,  BS normal  Musculoskeletal: No deformities, no cyanosis or clubbing  Neuro: alert, non focal  Skin: Warm, no lesions or rashes       Assessment & Plan:   Mild persistent asthma with allergic rhinitis without complication Mild persistent asthma with associated allergic rhinitis and associated allergic induced conjunctivitis Plan Continue singulair,  zyrtec/allegra/claritin/xyzal as before Continue astepro Try patanol eye drops 1 drop both eyes twice daily Return  1 year       Updated Medication List Outpatient Encounter Prescriptions as of 08/27/2012  Medication Sig Dispense Refill  .  albuterol (PROAIR HFA) 108 (90 BASE) MCG/ACT inhaler Inhale 2 puffs into the lungs every 6 (six) hours as needed.  1 Inhaler  6  . aspirin 81 MG tablet Take 81 mg by mouth every other day.        Marland Kitchen atorvastatin (LIPITOR) 20 MG tablet Take 20 mg by mouth daily.      . Azelastine HCl (ASTEPRO) 0.15 % SOLN Place 2 sprays into the nose daily.  30 mL  11  . calcium citrate-vitamin D 200-200 MG-UNIT TABS Take 2 tablets by mouth daily.        . chlorpheniramine (CHLOR-TRIMETON) 4 MG tablet Take 2 tablets (8 mg total) by mouth at bedtime as needed.  14 tablet    . fexofenadine (ALLEGRA) 180 MG tablet Take 180 mg by mouth daily. Alternates with zyrtec, allegra, or leveticitrine      . fish oil-omega-3 fatty acids 1000 MG capsule Take 1 g by mouth every other day.        . fluticasone (VERAMYST) 27.5 MCG/SPRAY nasal spray PLACE 2 SPRAYS INTO THE NOSE DAILY.  10 g  11  . montelukast (SINGULAIR) 10 MG tablet TAKE 1 TABLET (10 MG TOTAL) BY MOUTH AT BEDTIME.  30 tablet  11  . Multiple Vitamin (MULTIVITAMIN) tablet Take 1 tablet by mouth daily.        . ranitidine (ZANTAC) 150 MG capsule Take 150 mg by mouth 2 (two) times daily.        . [DISCONTINUED] Azelastine HCl (ASTEPRO) 0.15 % SOLN Place 2 puffs into the nose daily.  30 mL  11  . [DISCONTINUED] chlorpheniramine (CHLOR-TRIMETON) 4 MG tablet Take 4 mg by mouth at bedtime as needed.       . [DISCONTINUED] fluticasone (VERAMYST) 27.5 MCG/SPRAY nasal spray Place 2 sprays into the nose daily.  10 g  1  . [DISCONTINUED] montelukast (SINGULAIR) 10 MG tablet TAKE 1 TABLET (10 MG TOTAL) BY MOUTH AT BEDTIME.  30 tablet  0  . [DISCONTINUED] VERAMYST 27.5 MCG/SPRAY nasal spray PLACE 2 SPRAYS INTO THE NOSE DAILY.  10 g  1  .  cetirizine (ZYRTEC) 10 MG tablet Take 10 mg by mouth daily. Alternates with allegra, zyrtec, or leveticitrine      . levocetirizine (XYZAL) 5 MG tablet Take 1 tablet (5 mg total) by mouth every evening. Daily - alternating with zyrtec or allegra  30 tablet  11  . olopatadine (PATANOL) 0.1 % ophthalmic solution Place 1 drop into both eyes 2 (two) times daily.  5 mL  12  . [DISCONTINUED] ciprofloxacin (CIPRO) 250 MG tablet Take 1 tablet (250 mg total) by mouth 2 (two) times daily.  6 tablet  0  . [DISCONTINUED] levocetirizine (XYZAL) 5 MG tablet Take 1 tablet by mouth. Daily - alternating with zyrtec or allegra      . [DISCONTINUED] montelukast (SINGULAIR) 10 MG tablet Take 1 tablet (10 mg total) by mouth at bedtime.  30 tablet  11   No facility-administered encounter medications on file as of 08/27/2012.

## 2012-08-27 NOTE — Patient Instructions (Addendum)
Continue singulair, zyrtec/allegra/claritin/xyzal as before Continue astepro Try patanol eye drops 1 drop both eyes twice daily Return  1 year

## 2012-08-28 NOTE — Assessment & Plan Note (Signed)
Mild persistent asthma with associated allergic rhinitis and associated allergic induced conjunctivitis Plan Continue singulair, zyrtec/allegra/claritin/xyzal as before Continue astepro Try patanol eye drops 1 drop both eyes twice daily Return  1 year

## 2012-09-09 ENCOUNTER — Ambulatory Visit (INDEPENDENT_AMBULATORY_CARE_PROVIDER_SITE_OTHER): Payer: Medicare Other | Admitting: Gynecology

## 2012-09-09 ENCOUNTER — Encounter: Payer: Self-pay | Admitting: Gynecology

## 2012-09-09 DIAGNOSIS — N898 Other specified noninflammatory disorders of vagina: Secondary | ICD-10-CM

## 2012-09-09 DIAGNOSIS — N949 Unspecified condition associated with female genital organs and menstrual cycle: Secondary | ICD-10-CM

## 2012-09-09 LAB — WET PREP FOR TRICH, YEAST, CLUE

## 2012-09-09 MED ORDER — METRONIDAZOLE 500 MG PO TABS: 500.0000 mg | ORAL_TABLET | Freq: Two times a day (BID) | ORAL | Status: DC

## 2012-09-09 NOTE — Patient Instructions (Signed)
Take Flagyl antibiotic twice daily for 7 days. Avoid alcohol while taking. Followup if symptoms persist, worsen or recur. 

## 2012-09-09 NOTE — Progress Notes (Signed)
The patient presents with several day history of vaginal odor. No real discharge. Recent swimming in a pool which seemed to precipitate a previous episode of bacterial vaginosis.  Exam was [employer] Pelvic external BUS vagina with atrophic changes mild rectocele noted. Mild generalized vulvitis. No specific lesions. Scant discharge. Cervix atrophic. Uterus normal size midline mobile nontender. Adnexa without masses or tenderness.  Assessment and plan: Symptoms and exam consistent with mild bacterial vaginosis. Wet prep is unremarkable. Will cover with Flagyl 500 mg twice a day x7 days. Alcohol avoidance reviewed. Followup if symptoms persist, worsen or recur.

## 2012-09-24 ENCOUNTER — Other Ambulatory Visit: Payer: Self-pay

## 2012-10-05 ENCOUNTER — Other Ambulatory Visit: Payer: Self-pay | Admitting: Critical Care Medicine

## 2012-10-06 NOTE — Telephone Encounter (Signed)
Xyzal 5 mg rx was sent to [facility] on 08/27/12 # 30 x 11. Called [facility], spoke with [facility].  They did receive July rx.  Nothing further needed.

## 2012-12-25 ENCOUNTER — Other Ambulatory Visit: Payer: Self-pay

## 2013-02-20 ENCOUNTER — Other Ambulatory Visit: Payer: Self-pay | Admitting: Ophthalmology

## 2013-02-20 LAB — CBC WITH DIFFERENTIAL/PLATELET
Basophil #: 0.1 10*3/uL (ref 0.0–0.1)
Basophil %: 1 %
Eosinophil #: 0.3 10*3/uL (ref 0.0–0.7)
Eosinophil %: 4 %
HCT: 37.5 % (ref 35.0–47.0)
HGB: 12.6 g/dL (ref 12.0–16.0)
Lymphocyte #: 1.8 10*3/uL (ref 1.0–3.6)
Lymphocyte %: 27.9 %
MCH: 30.4 pg (ref 26.0–34.0)
MCHC: 33.6 g/dL (ref 32.0–36.0)
MCV: 90 fL (ref 80–100)
Monocyte #: 0.5 x10 3/mm (ref 0.2–0.9)
Monocyte %: 7.9 %
Neutrophil #: 3.8 10*3/uL (ref 1.4–6.5)
Neutrophil %: 59.2 %
PLATELETS: 250 10*3/uL (ref 150–440)
RBC: 4.15 10*6/uL (ref 3.80–5.20)
RDW: 13.4 % (ref 11.5–14.5)
WBC: 6.5 10*3/uL (ref 3.6–11.0)

## 2013-02-20 LAB — SEDIMENTATION RATE: Erythrocyte Sed Rate: 44 mm/hr — ABNORMAL HIGH (ref 0–30)

## 2013-05-06 ENCOUNTER — Other Ambulatory Visit: Payer: Self-pay | Admitting: Critical Care Medicine

## 2013-07-02 DIAGNOSIS — J45909 Unspecified asthma, uncomplicated: Secondary | ICD-10-CM | POA: Insufficient documentation

## 2013-07-14 ENCOUNTER — Encounter: Payer: Medicare Other | Admitting: Gynecology

## 2013-07-27 ENCOUNTER — Ambulatory Visit (INDEPENDENT_AMBULATORY_CARE_PROVIDER_SITE_OTHER): Payer: Medicare Other | Admitting: Critical Care Medicine

## 2013-07-27 ENCOUNTER — Encounter: Payer: Self-pay | Admitting: Critical Care Medicine

## 2013-07-27 ENCOUNTER — Telehealth: Payer: Self-pay | Admitting: Critical Care Medicine

## 2013-07-27 VITALS — BP 116/78 | HR 75 | Temp 98.8°F | Ht 63.5 in | Wt 181.2 lb

## 2013-07-27 DIAGNOSIS — J4530 Mild persistent asthma, uncomplicated: Secondary | ICD-10-CM

## 2013-07-27 DIAGNOSIS — J45909 Unspecified asthma, uncomplicated: Secondary | ICD-10-CM

## 2013-07-27 NOTE — Assessment & Plan Note (Signed)
Mild intermittent asthma with allergic rhinitis stable at this time Plan Maintain Singulair daily Maintain short acting bronchodilator as needed No inhaled steroid is indicated at this time

## 2013-07-27 NOTE — Telephone Encounter (Signed)
Called spoke with pt. She is going to come in 08/19/13. Nothing further needed.

## 2013-07-27 NOTE — Telephone Encounter (Signed)
i forgot this. Sorry Yes she needs a prevnar 13

## 2013-07-27 NOTE — Patient Instructions (Signed)
No change in medications. Return in          1 year 

## 2013-07-27 NOTE — Progress Notes (Signed)
Subjective:    Patient ID: [name], female    DOB: 1945/12/12, 68 y.o.   MRN: [id number]  HPI  68 y.o. wf never smoker with history of moderate persistent asthma. . The patient had significant allergic rhinitis with postnasal drip syndrome and reflux disease aggravating a cyclic cough.   07/27/2013 Chief Complaint  Patient presents with  . Yearly Follow up    Has sneezing, nonprod cough, itchy nose, and PND and feel this is related to allergies.  Has SOB when walking - feels this is a little worse since last OV.    pt seen 52yr f/u.   Now not much cough.  Pt doing fair.  Pt notes more sneezing and itching .  No new issues. Pt denies any significant sore throat, nasal congestion or excess secretions, fever, chills, sweats, unintended weight loss, pleurtic or exertional chest pain, orthopnea PND, or leg swelling Pt denies any increase in rescue therapy over baseline, denies waking up needing it or having any early am or nocturnal exacerbations of coughing/wheezing/or dyspnea. Pt also denies any obvious fluctuation in symptoms with  weather or environmental change or other alleviating or aggravating factors   PUL ASTHMA HISTORY 07/27/2013 08/21/2011 11/30/2010 08/01/2010  Symptoms 0-2 days/week 0-2 days/week 0-2 days/week 0-2 days/week  Nighttime awakenings 0-2/month 0-2/month 0-2/month 0-2/month  Interference with activity No limitations No limitations No limitations No limitations  SABA use 0-2 days/wk 0-2 days/wk 0-2 days/wk 0-2 days/wk  Exacerbations requiring oral steroids 0-1 / year 0-1 / year 0-1 / year 0-1 / year   Review of Systems  Constitutional:   No  weight loss, night sweats,  Fevers, chills, fatigue, lassitude. HEENT:   No headaches,  Difficulty swallowing,  Tooth/dental problems, No  Sore throat,   + nasal congestion, ++post nasal drip,   CV:  No chest pain,  Orthopnea, PND, swelling in lower extremities, anasarca, dizziness, palpitations  GI  Notes  Heartburn, no   indigestion, abdominal pain, nausea, vomiting, diarrhea, change in bowel habits, loss of appetite  Resp:    No coughing up of blood.    No chest wall deformity  Skin: no rash or lesions.  GU: no dysuria, change in color of urine, no urgency or frequency.  No flank pain.  MS:  No joint pain or swelling.  No decreased range of motion.  No back pain.  Psych:  No change in mood or affect. No depression or anxiety.  No memory loss.     Objective:   Physical Exam  Filed Vitals:   07/27/13 1153  BP: 116/78  Pulse: 75  Temp: 98.8 F (37.1 C)  TempSrc: Oral  Height: 5' 3.5" (1.613 m)  Weight: 181 lb 3.2 oz (82.192 kg)  SpO2: 95%    Gen: Pleasant, well-nourished, in no distress,  normal affect  ENT: No lesions,  mouth clear,  oropharynx clear,    Neck: No JVD, no TMG, no carotid bruits  Lungs: No use of accessory muscles, no dullness to percussion, clear , no wheeze  Cardiovascular: RRR, heart sounds normal, no murmur or gallops, no peripheral edema  Abdomen: soft and NT, no HSM,  BS normal  Musculoskeletal: No deformities, no cyanosis or clubbing  Neuro: alert, non focal  Skin: Warm, no lesions or rashes       Assessment & Plan:   Mild persistent asthma with allergic rhinitis without complication Mild intermittent asthma with allergic rhinitis stable at this time Plan Maintain Singulair daily Maintain short acting bronchodilator as  needed No inhaled steroid is indicated at this time   Updated Medication List Outpatient Encounter Prescriptions as of 07/27/2013  Medication Sig  . albuterol (PROAIR HFA) 108 (90 BASE) MCG/ACT inhaler Inhale 2 puffs into the lungs every 6 (six) hours as needed.  Marland Kitchen ammonium lactate (AMLACTIN) 12 % cream Apply topically as needed for dry skin.  Marland Kitchen atorvastatin (LIPITOR) 20 MG tablet Take 10 mg by mouth every other day.   . Azelastine HCl 0.15 % SOLN USE 2 SPRAYS IN EACH NOSTRIL ONCE DAILY  . calcium citrate-vitamin D 200-200 MG-UNIT  TABS Take 2 tablets by mouth daily.    . cetirizine (ZYRTEC) 10 MG tablet Take 10 mg by mouth daily. Alternates with allegra, zyrtec  . chlorpheniramine (CHLOR-TRIMETON) 4 MG tablet Take 2 tablets (8 mg total) by mouth at bedtime as needed.  . fexofenadine (ALLEGRA) 180 MG tablet Take 180 mg by mouth daily. Alternates with zyrtec, allegra,  . fluticasone (VERAMYST) 27.5 MCG/SPRAY nasal spray PLACE 2 SPRAYS INTO THE NOSE DAILY.  . montelukast (SINGULAIR) 10 MG tablet TAKE 1 TABLET (10 MG TOTAL) BY MOUTH AT BEDTIME.  . Multiple Vitamin (MULTIVITAMIN) tablet Take 1 tablet by mouth daily.    . naftifine (NAFTIN) 1 % cream as needed.  . ranitidine (ZANTAC) 150 MG capsule Take 150 mg by mouth 2 (two) times daily.    Marland Kitchen triamcinolone cream (KENALOG) 0.1 % as needed.  . [DISCONTINUED] Azelastine HCl (ASTEPRO) 0.15 % SOLN Place 2 sprays into the nose daily.  . fish oil-omega-3 fatty acids 1000 MG capsule Take 1 g by mouth every other day.    . levocetirizine (XYZAL) 5 MG tablet Take 1 tablet (5 mg total) by mouth every evening. Daily - alternating with zyrtec or allegra  . [DISCONTINUED] aspirin 81 MG tablet Take 81 mg by mouth every other day.    . [DISCONTINUED] metroNIDAZOLE (FLAGYL) 500 MG tablet Take 1 tablet (500 mg total) by mouth 2 (two) times daily. For 7 days.  Avoid alcohol while taking

## 2013-07-27 NOTE — Telephone Encounter (Signed)
Called, spoke with pt - She had a pna shot in 2011.  She would like a booster.  Dr. Joya Gaskins, pls advise if pt needs pneumovax or prevnar 13.   Pt is aware we will call her back to schedule this once answered by PW. Thank you.

## 2013-07-27 NOTE — Telephone Encounter (Signed)
Needs prevnar 13

## 2013-07-28 ENCOUNTER — Encounter: Payer: Self-pay | Admitting: Gynecology

## 2013-08-19 ENCOUNTER — Ambulatory Visit (INDEPENDENT_AMBULATORY_CARE_PROVIDER_SITE_OTHER): Payer: Medicare Other | Admitting: Gynecology

## 2013-08-19 ENCOUNTER — Other Ambulatory Visit (HOSPITAL_COMMUNITY)
Admission: RE | Admit: 2013-08-19 | Discharge: 2013-08-19 | Disposition: A | Discharge status: Home / Self Care | Payer: Medicare Other | Source: Ambulatory Visit | Attending: Gynecology | Admitting: Gynecology

## 2013-08-19 ENCOUNTER — Encounter: Payer: Self-pay | Admitting: Gynecology

## 2013-08-19 ENCOUNTER — Ambulatory Visit: Payer: Medicare Other

## 2013-08-19 ENCOUNTER — Telehealth: Payer: Self-pay | Admitting: Critical Care Medicine

## 2013-08-19 VITALS — BP 120/78 | Ht 64.0 in | Wt 179.0 lb

## 2013-08-19 DIAGNOSIS — Z124 Encounter for screening for malignant neoplasm of cervix: Secondary | ICD-10-CM

## 2013-08-19 DIAGNOSIS — N952 Postmenopausal atrophic vaginitis: Secondary | ICD-10-CM

## 2013-08-19 DIAGNOSIS — N816 Rectocele: Secondary | ICD-10-CM | POA: Insufficient documentation

## 2013-08-19 DIAGNOSIS — K649 Unspecified hemorrhoids: Secondary | ICD-10-CM

## 2013-08-19 NOTE — Addendum Note (Signed)
Addended by: Nelva Nay on: 08/19/2013 10:06 AM   Modules accepted: Orders

## 2013-08-19 NOTE — Progress Notes (Signed)
[name] 04-19-1945 [id number]        68 y.o.  [id number] for followup exam. Several issues noted below.  Past medical history,surgical history, problem list, medications, allergies, family history and social history were all reviewed and documented as reviewed in the EPIC chart.  ROS:  12 system ROS performed with pertinent positives and negatives included in the history, assessment and plan.   Additional significant findings :  None   Exam: Kim Counsellor Vitals:   08/19/13 0914  BP: 120/78  Height: 5\' 4"  (1.626 m)  Weight: 179 lb (81.194 kg)   General appearance:  Normal affect, orientation and appearance. Skin: Grossly normal HEENT: Without gross lesions.  No cervical or supraclavicular adenopathy. Thyroid normal.  Lungs:  Clear without wheezing, rales or rhonchi Cardiac: RR, without RMG Abdominal:  Soft, nontender, without masses, guarding, rebound, organomegaly or hernia Breasts:  Examined lying and sitting without masses, retractions, discharge or axillary adenopathy. Pelvic:  Ext/BUS/vagina with generalized atrophic changes. First degree rectocele noted  Cervix with atrophic changes. Pap done  Uterus anteverted, normal size, shape and contour, midline and mobile nontender   Adnexa  Without masses or tenderness    Anus and perineum  with external old hemorrhoids  Rectovaginal  Normal sphincter tone without palpated masses or tenderness.    Assessment/Plan:  68 y.o. [id number] female for followup exam  1. Postmenopausal/atrophic genital changes. Without significant symptoms of hot flushes, night sweats, vaginal dryness or dyspareunia. No vaginal bleeding. Will continue to monitor. Call with any vaginal bleeding. 2. Rectocele. Had been noted by Dr. Cherylann Banas previously. I did not noted last year but she does have a first degree rectocele noted today. No significant symptoms such as pressure, something from the vagina or stool trapping. Will continue to monitor. Call if  any issues. 3. External hemorrhoids, old. Present for years. Not overly bothersome to the patient. We'll continue to monitor. 4. Osteopenia. DEXA 07/2010 T score -1.1 FRAX 7.7%/0.5%. Relatively stable from prior DEXA 2006. Plan repeat in another year or 2. Increase calcium/vitamin D reviewed. 5. Mammography 07/2013. Continued annual mammography. 6. Pap smear 2012. Pap smear done today. No history of abnormal Pap smears. Options to stop screening altogether she is over the age of 85 versus less frequent screening intervals reviewed. Will readdress on an annual basis. 7. Colonoscopy planned for next year. 8. Health maintenance. No blood work done as this is done through her primary physician's office. Followup one year, sooner as needed.   Note: This document was prepared with digital dictation and possible smart phrase technology. Any transcriptional errors that result from this process are unintentional.   [name], 9:58 AM 08/19/2013

## 2013-08-19 NOTE — Patient Instructions (Signed)
Followup in one year for annual exam, sooner if any issues.  You may obtain a copy of any labs that were done today by logging onto MyChart as outlined in the instructions provided with your AVS (after visit summary). The office will not call with normal lab results but certainly if there are any significant abnormalities then we will contact you.   Health Maintenance, Female A healthy lifestyle and preventative care can promote health and wellness.  Maintain regular health, dental, and eye exams.  Eat a healthy diet. Foods like vegetables, fruits, whole grains, low-fat dairy products, and lean protein foods contain the nutrients you need without too many calories. Decrease your intake of foods high in solid fats, added sugars, and salt. Get information about a proper diet from your caregiver, if necessary.  Regular physical exercise is one of the most important things you can do for your health. Most adults should get at least 150 minutes of moderate-intensity exercise (any activity that increases your heart rate and causes you to sweat) each week. In addition, most adults need muscle-strengthening exercises on 2 or more days a week.   Maintain a healthy weight. The body mass index (BMI) is a screening tool to identify possible weight problems. It provides an estimate of body fat based on height and weight. Your caregiver can help determine your BMI, and can help you achieve or maintain a healthy weight. For adults 20 years and older:  A BMI below 18.5 is considered underweight.  A BMI of 18.5 to 24.9 is normal.  A BMI of 25 to 29.9 is considered overweight.  A BMI of 30 and above is considered obese.  Maintain normal blood lipids and cholesterol by exercising and minimizing your intake of saturated fat. Eat a balanced diet with plenty of fruits and vegetables. Blood tests for lipids and cholesterol should begin at age 20 and be repeated every 5 years. If your lipid or cholesterol levels are  high, you are over 50, or you are a high risk for heart disease, you may need your cholesterol levels checked more frequently.Ongoing high lipid and cholesterol levels should be treated with medicines if diet and exercise are not effective.  If you smoke, find out from your caregiver how to quit. If you do not use tobacco, do not start.  Lung cancer screening is recommended for adults aged 55 80 years who are at high risk for developing lung cancer because of a history of smoking. Yearly low-dose computed tomography (CT) is recommended for people who have at least a 30-pack-year history of smoking and are a current smoker or have quit within the past 15 years. A pack year of smoking is smoking an average of 1 pack of cigarettes a day for 1 year (for example: 1 pack a day for 30 years or 2 packs a day for 15 years). Yearly screening should continue until the smoker has stopped smoking for at least 15 years. Yearly screening should also be stopped for people who develop a health problem that would prevent them from having lung cancer treatment.  If you are pregnant, do not drink alcohol. If you are breastfeeding, be very cautious about drinking alcohol. If you are not pregnant and choose to drink alcohol, do not exceed 1 drink per day. One drink is considered to be 12 ounces (355 mL) of beer, 5 ounces (148 mL) of wine, or 1.5 ounces (44 mL) of liquor.  Avoid use of street drugs. Do not share needles with   anyone. Ask for help if you need support or instructions about stopping the use of drugs.  High blood pressure causes heart disease and increases the risk of stroke. Blood pressure should be checked at least every 1 to 2 years. Ongoing high blood pressure should be treated with medicines, if weight loss and exercise are not effective.  If you are 55 to 67 years old, ask your caregiver if you should take aspirin to prevent strokes.  Diabetes screening involves taking a blood sample to check your fasting  blood sugar level. This should be done once every 3 years, after age 45, if you are within normal weight and without risk factors for diabetes. Testing should be considered at a younger age or be carried out more frequently if you are overweight and have at least 1 risk factor for diabetes.  Breast cancer screening is essential preventative care for women. You should practice "breast self-awareness." This means understanding the normal appearance and feel of your breasts and may include breast self-examination. Any changes detected, no matter how small, should be reported to a caregiver. Women in their 20s and 30s should have a clinical breast exam (CBE) by a caregiver as part of a regular health exam every 1 to 3 years. After age 40, women should have a CBE every year. Starting at age 40, women should consider having a mammogram (breast X-ray) every year. Women who have a family history of breast cancer should talk to their caregiver about genetic screening. Women at a high risk of breast cancer should talk to their caregiver about having an MRI and a mammogram every year.  Breast cancer gene (BRCA)-related cancer risk assessment is recommended for women who have family members with BRCA-related cancers. BRCA-related cancers include breast, ovarian, tubal, and peritoneal cancers. Having family members with these cancers may be associated with an increased risk for harmful changes (mutations) in the breast cancer genes BRCA1 and BRCA2. Results of the assessment will determine the need for genetic counseling and BRCA1 and BRCA2 testing.  The Pap test is a screening test for cervical cancer. Women should have a Pap test starting at age 21. Between ages 21 and 29, Pap tests should be repeated every 2 years. Beginning at age 30, you should have a Pap test every 3 years as long as the past 3 Pap tests have been normal. If you had a hysterectomy for a problem that was not cancer or a condition that could lead to  cancer, then you no longer need Pap tests. If you are between ages 65 and 70, and you have had normal Pap tests going back 10 years, you no longer need Pap tests. If you have had past treatment for cervical cancer or a condition that could lead to cancer, you need Pap tests and screening for cancer for at least 20 years after your treatment. If Pap tests have been discontinued, risk factors (such as a new sexual partner) need to be reassessed to determine if screening should be resumed. Some women have medical problems that increase the chance of getting cervical cancer. In these cases, your caregiver may recommend more frequent screening and Pap tests.  The human papillomavirus (HPV) test is an additional test that may be used for cervical cancer screening. The HPV test looks for the virus that can cause the cell changes on the cervix. The cells collected during the Pap test can be tested for HPV. The HPV test could be used to screen women aged 30   years and older, and should be used in women of any age who have unclear Pap test results. After the age of 30, women should have HPV testing at the same frequency as a Pap test.  Colorectal cancer can be detected and often prevented. Most routine colorectal cancer screening begins at the age of 50 and continues through age 75. However, your caregiver may recommend screening at an earlier age if you have risk factors for colon cancer. On a yearly basis, your caregiver may provide home test kits to check for hidden blood in the stool. Use of a small camera at the end of a tube, to directly examine the colon (sigmoidoscopy or colonoscopy), can detect the earliest forms of colorectal cancer. Talk to your caregiver about this at age 50, when routine screening begins. Direct examination of the colon should be repeated every 5 to 10 years through age 75, unless early forms of pre-cancerous polyps or small growths are found.  Hepatitis C blood testing is recommended for  all people born from 1945 through 1965 and any individual with known risks for hepatitis C.  Practice safe sex. Use condoms and avoid high-risk sexual practices to reduce the spread of sexually transmitted infections (STIs). Sexually active women aged 25 and younger should be checked for Chlamydia, which is a common sexually transmitted infection. Older women with new or multiple partners should also be tested for Chlamydia. Testing for other STIs is recommended if you are sexually active and at increased risk.  Osteoporosis is a disease in which the bones lose minerals and strength with aging. This can result in serious bone fractures. The risk of osteoporosis can be identified using a bone density scan. Women ages 65 and over and women at risk for fractures or osteoporosis should discuss screening with their caregivers. Ask your caregiver whether you should be taking a calcium supplement or vitamin D to reduce the rate of osteoporosis.  Menopause can be associated with physical symptoms and risks. Hormone replacement therapy is available to decrease symptoms and risks. You should talk to your caregiver about whether hormone replacement therapy is right for you.  Use sunscreen. Apply sunscreen liberally and repeatedly throughout the day. You should seek shade when your shadow is shorter than you. Protect yourself by wearing long sleeves, pants, a wide-brimmed hat, and sunglasses year round, whenever you are outdoors.  Notify your caregiver of new moles or changes in moles, especially if there is a change in shape or color. Also notify your caregiver if a mole is larger than the size of a pencil eraser.  Stay current with your immunizations. Document Released: 08/21/2010 Document Revised: 06/02/2012 Document Reviewed: 08/21/2010 [employer] Patient Information 2014 [employer].   

## 2013-08-19 NOTE — Telephone Encounter (Signed)
Spoke with patient-- wanted to check and make sure that she was was correct in stating that the Prevnar is the "booster" PNA vaccine which can be given after regular PNA vaccine. Nothing further needed.

## 2013-08-20 LAB — CYTOLOGY - PAP

## 2013-08-20 LAB — URINALYSIS W MICROSCOPIC + REFLEX CULTURE
Bacteria, UA: NONE SEEN
Bilirubin Urine: NEGATIVE
CRYSTALS: NONE SEEN
Casts: NONE SEEN
Glucose, UA: NEGATIVE mg/dL
Hgb urine dipstick: NEGATIVE
Ketones, ur: NEGATIVE mg/dL
NITRITE: NEGATIVE
Protein, ur: NEGATIVE mg/dL
SPECIFIC GRAVITY, URINE: 1.014 (ref 1.005–1.030)
Squamous Epithelial / LPF: NONE SEEN
UROBILINOGEN UA: 0.2 mg/dL (ref 0.0–1.0)
pH: 5.5 (ref 5.0–8.0)

## 2013-08-21 LAB — URINE CULTURE: Colony Count: 7000

## 2013-08-24 ENCOUNTER — Telehealth: Payer: Self-pay | Admitting: *Deleted

## 2013-08-24 NOTE — Telephone Encounter (Signed)
Pt informed with negative u/a results on 08/19/13.

## 2013-08-26 ENCOUNTER — Ambulatory Visit: Payer: Medicare Other

## 2013-08-28 ENCOUNTER — Other Ambulatory Visit: Payer: Self-pay | Admitting: Critical Care Medicine

## 2013-09-02 ENCOUNTER — Other Ambulatory Visit: Payer: Self-pay | Admitting: Critical Care Medicine

## 2013-09-02 ENCOUNTER — Ambulatory Visit: Payer: Medicare Other | Admitting: Critical Care Medicine

## 2013-11-12 ENCOUNTER — Encounter: Payer: Self-pay | Admitting: Adult Health

## 2013-11-12 ENCOUNTER — Ambulatory Visit (INDEPENDENT_AMBULATORY_CARE_PROVIDER_SITE_OTHER): Payer: Medicare Other | Admitting: Adult Health

## 2013-11-12 VITALS — BP 114/82 | HR 81 | Temp 97.6°F | Ht 63.0 in | Wt 180.0 lb

## 2013-11-12 DIAGNOSIS — Z23 Encounter for immunization: Secondary | ICD-10-CM

## 2013-11-12 DIAGNOSIS — J45909 Unspecified asthma, uncomplicated: Secondary | ICD-10-CM

## 2013-11-12 DIAGNOSIS — J4530 Mild persistent asthma, uncomplicated: Secondary | ICD-10-CM

## 2013-11-12 MED ORDER — PREDNISONE 10 MG PO TABS: ORAL_TABLET | ORAL | Status: DC

## 2013-11-12 NOTE — Patient Instructions (Signed)
Prednisone taper over next week .  Saline nasal rinses As needed   Ayr saline gel As needed   Continue on current regimen  Follow up Dr. Joya Gaskins  In 6-8 weeks and .As needed   Please contact office for sooner follow up if symptoms do not improve or worsen or seek emergency care

## 2013-11-12 NOTE — Progress Notes (Signed)
   Subjective:    Patient ID: [name], female    DOB: 02-24-1945, 68 y.o.   MRN: [id number]  HPI 68 yo wf never smoker with history of moderate persistent asthma. . The patient had significant allergic rhinitis with postnasal drip syndrome and reflux disease aggravating a cyclic cough.  11/12/2013 Acute OV  Complains of post nasal drip ,nonprod cough, increase in mucus production and increase in SOB with activity over last few months .   Pt states she is now constantly having these symptoms but seems to wax and wane.  Pt states she has allergies to cat dander and currently has cat and questions if these s/s are d/t allergies.  Currently taking singulair, zyrtec, chlortrimeton,  veramyst and astelin .  Is worried that constant exposure to allergens and chronic inflammation  No discolored mucus, fever, chest pain , orthopnea, or edema.   Review of Systems Constitutional:   No  weight loss, night sweats,  Fevers, chills, + fatigue, or  lassitude.  HEENT:   No headaches,  Difficulty swallowing,  Tooth/dental problems, or  Sore throat,                No sneezing, itching, ear ache, nasal congestion, post nasal drip,   CV:  No chest pain,  Orthopnea, PND, swelling in lower extremities, anasarca, dizziness, palpitations, syncope.   GI  No heartburn, indigestion, abdominal pain, nausea, vomiting, diarrhea, change in bowel habits, loss of appetite, bloody stools.   Resp:  .  No chest wall deformity  Skin: no rash or lesions.  GU: no dysuria, change in color of urine, no urgency or frequency.  No flank pain, no hematuria   MS:  No joint pain or swelling.  No decreased range of motion.  No back pain.  Psych:  No change in mood or affect. No depression or anxiety.  No memory loss.         Objective:   Physical Exam GEN: A/Ox3; pleasant , NAD,  Elderly   HEENT:  [place]/AT,  EACs-clear, TMs-wnl, NOSE-clear drainage  THROAT-clear, no lesions, no postnasal drip or exudate noted.   NECK:   Supple w/ fair ROM; no JVD; normal carotid impulses w/o bruits; no thyromegaly or nodules palpated; no lymphadenopathy.  RESP  Clear  P & A; w/o, wheezes/ rales/ or rhonchi.no accessory muscle use, no dullness to percussion  CARD:  RRR, no m/r/g  , no peripheral edema, pulses intact, no cyanosis or clubbing.  GI:   Soft & nt; nml bowel sounds; no organomegaly or masses detected.  Musco: Warm bil, no deformities or joint swelling noted.   Neuro: alert, no focal deficits noted.    Skin: Warm, no lesions or rashes         Assessment & Plan:

## 2013-11-12 NOTE — Assessment & Plan Note (Signed)
Flare with AR   Plan  Prednisone taper over next week .  Saline nasal rinses As needed   Ayr saline gel As needed   Continue on current regimen  Follow up Dr. Joya Gaskins  In 6-8 weeks and .As needed   Please contact office for sooner follow up if symptoms do not improve or worsen or seek emergency care

## 2013-11-15 ENCOUNTER — Other Ambulatory Visit: Payer: Self-pay | Admitting: Critical Care Medicine

## 2013-11-23 ENCOUNTER — Ambulatory Visit: Payer: Medicare Other | Admitting: Critical Care Medicine

## 2013-12-03 ENCOUNTER — Other Ambulatory Visit: Payer: Self-pay | Admitting: Critical Care Medicine

## 2013-12-09 DIAGNOSIS — D352 Benign neoplasm of pituitary gland: Secondary | ICD-10-CM | POA: Insufficient documentation

## 2013-12-09 DIAGNOSIS — H35341 Macular cyst, hole, or pseudohole, right eye: Secondary | ICD-10-CM | POA: Insufficient documentation

## 2013-12-09 DIAGNOSIS — Z86018 Personal history of other benign neoplasm: Secondary | ICD-10-CM | POA: Insufficient documentation

## 2013-12-09 DIAGNOSIS — H43813 Vitreous degeneration, bilateral: Secondary | ICD-10-CM | POA: Insufficient documentation

## 2013-12-21 ENCOUNTER — Encounter: Payer: Self-pay | Admitting: Adult Health

## 2013-12-31 ENCOUNTER — Ambulatory Visit: Payer: Medicare Other | Admitting: Critical Care Medicine

## 2014-03-23 ENCOUNTER — Other Ambulatory Visit: Payer: Self-pay | Admitting: Critical Care Medicine

## 2014-04-11 ENCOUNTER — Other Ambulatory Visit: Payer: Self-pay | Admitting: Critical Care Medicine

## 2014-09-07 ENCOUNTER — Encounter: Payer: Self-pay | Admitting: Gynecology

## 2014-09-13 ENCOUNTER — Other Ambulatory Visit: Payer: Self-pay | Admitting: Critical Care Medicine

## 2014-09-16 ENCOUNTER — Other Ambulatory Visit: Payer: Self-pay | Admitting: Critical Care Medicine

## 2014-09-22 ENCOUNTER — Ambulatory Visit (INDEPENDENT_AMBULATORY_CARE_PROVIDER_SITE_OTHER): Payer: Medicare Other | Admitting: Gynecology

## 2014-09-22 ENCOUNTER — Encounter: Payer: Self-pay | Admitting: Gynecology

## 2014-09-22 VITALS — BP 122/76 | Ht 63.0 in | Wt 177.0 lb

## 2014-09-22 DIAGNOSIS — N952 Postmenopausal atrophic vaginitis: Secondary | ICD-10-CM | POA: Diagnosis not present

## 2014-09-22 DIAGNOSIS — Z01419 Encounter for gynecological examination (general) (routine) without abnormal findings: Secondary | ICD-10-CM | POA: Diagnosis not present

## 2014-09-22 DIAGNOSIS — N816 Rectocele: Secondary | ICD-10-CM

## 2014-09-22 DIAGNOSIS — K649 Unspecified hemorrhoids: Secondary | ICD-10-CM

## 2014-09-22 NOTE — Patient Instructions (Signed)
You may obtain a copy of any labs that were done today by logging onto MyChart as outlined in the instructions provided with your AVS (after visit summary). The office will not call with normal lab results but certainly if there are any significant abnormalities then we will contact you.   Health Maintenance, Female A healthy lifestyle and preventative care can promote health and wellness.  Maintain regular health, dental, and eye exams.  Eat a healthy diet. Foods like vegetables, fruits, whole grains, low-fat dairy products, and lean protein foods contain the nutrients you need without too many calories. Decrease your intake of foods high in solid fats, added sugars, and salt. Get information about a proper diet from your caregiver, if necessary.  Regular physical exercise is one of the most important things you can do for your health. Most adults should get at least 150 minutes of moderate-intensity exercise (any activity that increases your heart rate and causes you to sweat) each week. In addition, most adults need muscle-strengthening exercises on 2 or more days a week.   Maintain a healthy weight. The body mass index (BMI) is a screening tool to identify possible weight problems. It provides an estimate of body fat based on height and weight. Your caregiver can help determine your BMI, and can help you achieve or maintain a healthy weight. For adults 20 years and older:  A BMI below 18.5 is considered underweight.  A BMI of 18.5 to 24.9 is normal.  A BMI of 25 to 29.9 is considered overweight.  A BMI of 30 and above is considered obese.  Maintain normal blood lipids and cholesterol by exercising and minimizing your intake of saturated fat. Eat a balanced diet with plenty of fruits and vegetables. Blood tests for lipids and cholesterol should begin at age 61 and be repeated every 5 years. If your lipid or cholesterol levels are high, you are over 50, or you are a high risk for heart  disease, you may need your cholesterol levels checked more frequently.Ongoing high lipid and cholesterol levels should be treated with medicines if diet and exercise are not effective.  If you smoke, find out from your caregiver how to quit. If you do not use tobacco, do not start.  Lung cancer screening is recommended for adults aged 33 80 years who are at high risk for developing lung cancer because of a history of smoking. Yearly low-dose computed tomography (CT) is recommended for people who have at least a 30-pack-year history of smoking and are a current smoker or have quit within the past 15 years. A pack year of smoking is smoking an average of 1 pack of cigarettes a day for 1 year (for example: 1 pack a day for 30 years or 2 packs a day for 15 years). Yearly screening should continue until the smoker has stopped smoking for at least 15 years. Yearly screening should also be stopped for people who develop a health problem that would prevent them from having lung cancer treatment.  If you are pregnant, do not drink alcohol. If you are breastfeeding, be very cautious about drinking alcohol. If you are not pregnant and choose to drink alcohol, do not exceed 1 drink per day. One drink is considered to be 12 ounces (355 mL) of beer, 5 ounces (148 mL) of wine, or 1.5 ounces (44 mL) of liquor.  Avoid use of street drugs. Do not share needles with anyone. Ask for help if you need support or instructions about stopping  the use of drugs.  High blood pressure causes heart disease and increases the risk of stroke. Blood pressure should be checked at least every 1 to 2 years. Ongoing high blood pressure should be treated with medicines, if weight loss and exercise are not effective.  If you are 59 to 68 years old, ask your caregiver if you should take aspirin to prevent strokes.  Diabetes screening involves taking a blood sample to check your fasting blood sugar level. This should be done once every 3  years, after age 91, if you are within normal weight and without risk factors for diabetes. Testing should be considered at a younger age or be carried out more frequently if you are overweight and have at least 1 risk factor for diabetes.  Breast cancer screening is essential preventative care for women. You should practice "breast self-awareness." This means understanding the normal appearance and feel of your breasts and may include breast self-examination. Any changes detected, no matter how small, should be reported to a caregiver. Women in their 66s and 30s should have a clinical breast exam (CBE) by a caregiver as part of a regular health exam every 1 to 3 years. After age 101, women should have a CBE every year. Starting at age 100, women should consider having a mammogram (breast X-ray) every year. Women who have a family history of breast cancer should talk to their caregiver about genetic screening. Women at a high risk of breast cancer should talk to their caregiver about having an MRI and a mammogram every year.  Breast cancer gene (BRCA)-related cancer risk assessment is recommended for women who have family members with BRCA-related cancers. BRCA-related cancers include breast, ovarian, tubal, and peritoneal cancers. Having family members with these cancers may be associated with an increased risk for harmful changes (mutations) in the breast cancer genes BRCA1 and BRCA2. Results of the assessment will determine the need for genetic counseling and BRCA1 and BRCA2 testing.  The Pap test is a screening test for cervical cancer. Women should have a Pap test starting at age 57. Between ages 25 and 35, Pap tests should be repeated every 2 years. Beginning at age 37, you should have a Pap test every 3 years as long as the past 3 Pap tests have been normal. If you had a hysterectomy for a problem that was not cancer or a condition that could lead to cancer, then you no longer need Pap tests. If you are  between ages 50 and 76, and you have had normal Pap tests going back 10 years, you no longer need Pap tests. If you have had past treatment for cervical cancer or a condition that could lead to cancer, you need Pap tests and screening for cancer for at least 20 years after your treatment. If Pap tests have been discontinued, risk factors (such as a new sexual partner) need to be reassessed to determine if screening should be resumed. Some women have medical problems that increase the chance of getting cervical cancer. In these cases, your caregiver may recommend more frequent screening and Pap tests.  The human papillomavirus (HPV) test is an additional test that may be used for cervical cancer screening. The HPV test looks for the virus that can cause the cell changes on the cervix. The cells collected during the Pap test can be tested for HPV. The HPV test could be used to screen women aged 44 years and older, and should be used in women of any age  who have unclear Pap test results. After the age of 30, women should have HPV testing at the same frequency as a Pap test.  Colorectal cancer can be detected and often prevented. Most routine colorectal cancer screening begins at the age of 50 and continues through age 75. However, your caregiver may recommend screening at an earlier age if you have risk factors for colon cancer. On a yearly basis, your caregiver may provide home test kits to check for hidden blood in the stool. Use of a small camera at the end of a tube, to directly examine the colon (sigmoidoscopy or colonoscopy), can detect the earliest forms of colorectal cancer. Talk to your caregiver about this at age 50, when routine screening begins. Direct examination of the colon should be repeated every 5 to 10 years through age 75, unless early forms of pre-cancerous polyps or small growths are found.  Hepatitis C blood testing is recommended for all people born from 1945 through 1965 and any  individual with known risks for hepatitis C.  Practice safe sex. Use condoms and avoid high-risk sexual practices to reduce the spread of sexually transmitted infections (STIs). Sexually active women aged 25 and younger should be checked for Chlamydia, which is a common sexually transmitted infection. Older women with new or multiple partners should also be tested for Chlamydia. Testing for other STIs is recommended if you are sexually active and at increased risk.  Osteoporosis is a disease in which the bones lose minerals and strength with aging. This can result in serious bone fractures. The risk of osteoporosis can be identified using a bone density scan. Women ages 65 and over and women at risk for fractures or osteoporosis should discuss screening with their caregivers. Ask your caregiver whether you should be taking a calcium supplement or vitamin D to reduce the rate of osteoporosis.  Menopause can be associated with physical symptoms and risks. Hormone replacement therapy is available to decrease symptoms and risks. You should talk to your caregiver about whether hormone replacement therapy is right for you.  Use sunscreen. Apply sunscreen liberally and repeatedly throughout the day. You should seek shade when your shadow is shorter than you. Protect yourself by wearing long sleeves, pants, a wide-brimmed hat, and sunglasses year round, whenever you are outdoors.  Notify your caregiver of new moles or changes in moles, especially if there is a change in shape or color. Also notify your caregiver if a mole is larger than the size of a pencil eraser.  Stay current with your immunizations. Document Released: 08/21/2010 Document Revised: 06/02/2012 Document Reviewed: 08/21/2010 [employer] Patient Information 2014 [employer].   

## 2014-09-22 NOTE — Progress Notes (Signed)
[name] 1945/11/11 [id number]        69 y.o.  G2P2002 for breast and pelvic exam. Several issues noted below.  Past medical history,surgical history, problem list, medications, allergies, family history and social history were all reviewed and documented as reviewed in the EPIC chart.  ROS:  Performed with pertinent positives and negatives included in the history, assessment and plan.   Additional significant findings :  none   Exam: Kim Counsellor Vitals:   09/22/14 1403  BP: 122/76  Height: 5\' 3"  (1.6 m)  Weight: 177 lb (80.287 kg)   General appearance:  Normal affect, orientation and appearance. Skin: Grossly normal HEENT: Without gross lesions.  No cervical or supraclavicular adenopathy. Thyroid normal.  Lungs:  Clear without wheezing, rales or rhonchi Cardiac: RR, without RMG Abdominal:  Soft, nontender, without masses, guarding, rebound, organomegaly or hernia Breasts:  Examined lying and sitting without masses, retractions, discharge or axillary adenopathy. Pelvic:  Ext/BUS/vagina with atrophic changes. First to second-degree rectocele noted. Mild cystocele. Mild uterine prolapse.  Cervix with atrophic changes  Uterus anteverted, normal size, shape and contour, midline and mobile nontender   Adnexa  Without masses or tenderness    Anus and perineum  Normal with old external hemorrhoids  Rectovaginal  Normal sphincter tone without palpated masses or tenderness.    Assessment/Plan:  69 y.o. [id number] female for breast and pelvic exam.   1. Post menopausal/atrophic genital changes. Without significant symptoms of hot flashes, night sweats, vaginal dryness or any bleeding. Continue to monitor and report any bleeding. 2. Rectocele first to second degree, cystocele first-degree, mild uterine prolapse. Stable over serial exams. Patient's asymptomatic without pressure or stool trapping. We'll continue to monitor and report any issues. 3. Old external hemorrhoids.  Several  or pedunculated I think would be amenable to banding. Options for this reviewed with the patient. She has a colonoscopy scheduled next month with her gastroenterologist and she is can ask him about this. 4. Mammography 08/2014. Continue with annual mammography. SBE monthly reviewed. 5. Pap smear 2015. No Pap smear done today. No history of significant abnormal Pap smears. 6. DEXA 2012 T score -1.1 FRAX 7.7%/0.5%. Relatively stable from prior DEXA 2006. Plan repeat DEXA next year at five-year interval.  Increased calcium vitamin D. 7. Health maintenance. No routine blood work done as this is done at her primary physician's office. Follow up in one year, sooner as needed.   [name], 2:37 PM 09/22/2014

## 2014-09-23 LAB — URINALYSIS W MICROSCOPIC + REFLEX CULTURE
BACTERIA UA: NONE SEEN [HPF]
BILIRUBIN URINE: NEGATIVE
Casts: NONE SEEN [LPF]
Crystals: NONE SEEN [HPF]
Glucose, UA: NEGATIVE
HGB URINE DIPSTICK: NEGATIVE
Ketones, ur: NEGATIVE
Nitrite: NEGATIVE
PROTEIN: NEGATIVE
RBC / HPF: NONE SEEN RBC/HPF (ref <=2)
Specific Gravity, Urine: 1.005 (ref 1.001–1.035)
Squamous Epithelial / LPF: NONE SEEN [HPF] (ref <=5)
WBC, UA: NONE SEEN WBC/HPF (ref <=5)
Yeast: NONE SEEN [HPF]
pH: 7.5 (ref 5.0–8.0)

## 2014-09-26 LAB — URINE CULTURE: Colony Count: 15000

## 2014-09-28 ENCOUNTER — Other Ambulatory Visit: Payer: Self-pay | Admitting: Gynecology

## 2014-09-28 MED ORDER — AMPICILLIN 500 MG PO CAPS: 500.0000 mg | ORAL_CAPSULE | Freq: Four times a day (QID) | ORAL | Status: DC

## 2014-10-15 ENCOUNTER — Other Ambulatory Visit: Payer: Self-pay | Admitting: Critical Care Medicine

## 2014-10-20 ENCOUNTER — Telehealth: Payer: Self-pay | Admitting: Critical Care Medicine

## 2014-10-20 MED ORDER — MONTELUKAST SODIUM 10 MG PO TABS: 10.0000 mg | ORAL_TABLET | Freq: Every day | ORAL | Status: DC

## 2014-10-20 NOTE — Telephone Encounter (Signed)
Called and spoke to pt. Pt stated she is seeing another pulmonologist in 12/2014 as a new pt and is out of singulair. Rx sent to preferred pharmacy to last pt to appt. Pt aware she will then need the new pulmonologist to fill her pulmonary meds. Pt verbalized understanding and denied any further questions or concerns at this time.

## 2014-11-18 ENCOUNTER — Telehealth: Payer: Self-pay | Admitting: Critical Care Medicine

## 2014-11-18 DIAGNOSIS — J4530 Mild persistent asthma, uncomplicated: Secondary | ICD-10-CM

## 2014-11-18 NOTE — Telephone Encounter (Signed)
That is fine 

## 2014-11-18 NOTE — Telephone Encounter (Signed)
Called spoke with pt. Aware referral placed. Nothing further needed

## 2014-11-18 NOTE — Telephone Encounter (Signed)
Called spoke with pt. She reports since PW is no longer seeing patients, she wants a referral from him to Dr. Wynonia Musty w/ [facility] pulmonary specialty clinic fax [phone]. PW NA. TP are you okay if we place referral under your name? thanks

## 2014-11-24 ENCOUNTER — Telehealth: Payer: Self-pay | Admitting: *Deleted

## 2014-11-24 NOTE — Telephone Encounter (Signed)
Pt called urinary, frequent urination, going to take OTC Azo to see if any relief. Pt offered OV, but declined at the time. Pt said it is not that severe, will follow up next if needed.

## 2014-11-29 ENCOUNTER — Telehealth: Payer: Self-pay | Admitting: Adult Health

## 2014-11-29 NOTE — Telephone Encounter (Signed)
Received letter from [facility] pulmonary specialty CL [facility] stating that they have been unsuccessful at reaching pt in order to set up an appointment.  TP is aware I called pt's house number and was unable to LVM. Pt's cell phone number is not working. Will call back later

## 2014-12-02 ENCOUNTER — Ambulatory Visit: Payer: Medicare Other | Admitting: Gynecology

## 2014-12-03 NOTE — Telephone Encounter (Signed)
Unable to reach pt by phone. Letter was sent to address on file per TP's request. Nothing further needed will sign off.

## 2014-12-10 ENCOUNTER — Ambulatory Visit: Payer: Medicare Other | Admitting: Gynecology

## 2014-12-13 ENCOUNTER — Other Ambulatory Visit: Payer: Self-pay | Admitting: Critical Care Medicine

## 2014-12-27 ENCOUNTER — Ambulatory Visit (INDEPENDENT_AMBULATORY_CARE_PROVIDER_SITE_OTHER): Payer: Medicare Other | Admitting: Gynecology

## 2014-12-27 ENCOUNTER — Encounter: Payer: Self-pay | Admitting: Gynecology

## 2014-12-27 VITALS — BP 130/76

## 2014-12-27 DIAGNOSIS — N898 Other specified noninflammatory disorders of vagina: Secondary | ICD-10-CM

## 2014-12-27 DIAGNOSIS — L298 Other pruritus: Secondary | ICD-10-CM

## 2014-12-27 DIAGNOSIS — R300 Dysuria: Secondary | ICD-10-CM | POA: Diagnosis not present

## 2014-12-27 LAB — WET PREP FOR TRICH, YEAST, CLUE
Clue Cells Wet Prep HPF POC: NONE SEEN
TRICH WET PREP: NONE SEEN
Yeast Wet Prep HPF POC: NONE SEEN

## 2014-12-27 LAB — URINALYSIS W MICROSCOPIC + REFLEX CULTURE
BILIRUBIN URINE: NEGATIVE
Bacteria, UA: NONE SEEN [HPF]
CASTS: NONE SEEN [LPF]
CRYSTALS: NONE SEEN [HPF]
Glucose, UA: NEGATIVE
HGB URINE DIPSTICK: NEGATIVE
KETONES UR: NEGATIVE
Leukocytes, UA: NEGATIVE
NITRITE: NEGATIVE
PH: 7 (ref 5.0–8.0)
Protein, ur: NEGATIVE
RBC / HPF: NONE SEEN RBC/HPF (ref <=2)
Specific Gravity, Urine: 1.015 (ref 1.001–1.035)
WBC UA: NONE SEEN WBC/HPF (ref <=5)
Yeast: NONE SEEN [HPF]

## 2014-12-27 MED ORDER — FLUCONAZOLE 150 MG PO TABS: 150.0000 mg | ORAL_TABLET | Freq: Once | ORAL | Status: DC

## 2014-12-27 NOTE — Addendum Note (Signed)
Addended by: Nelva Nay on: 12/27/2014 04:14 PM   Modules accepted: Orders

## 2014-12-27 NOTE — Progress Notes (Signed)
Samiah Ricklefs Jagiello [birth year] [id number]        69 y.o.  [id number] presents complaining of some mild dysuria and vaginal itching. No real discharge. Used OTC Monistat 7 days without relief. No fever or chills low back pain urgency. Does note a little bit of frequency.  Past medical history,surgical history, problem list, medications, allergies, family history and social history were all reviewed and documented in the EPIC chart.  Directed ROS with pertinent positives and negatives documented in the history of present illness/assessment and plan.  Exam: Kim assistant Filed Vitals:   12/27/14 1517  BP: 130/76   General appearance:  Normal Abdomen soft nontender without masses guarding rebound Pelvic external BUS vagina with atrophic changes. Second-degree rectocele noted. No significant discharge. Bimanual without masses or tenderness  Assessment/Plan:  69 y.o. [id number] with above history. Wet prep and urinalysis are negative. We'll cover for yeast, partially treated with Diflucan 150 mg daily 3 days. Hold Lipitor while taking. Follow up if symptoms persist, worsen or recur.    [name], 3:44 PM 12/27/2014

## 2014-12-27 NOTE — Patient Instructions (Signed)
Take the Diflucan pill daily for 3 days. Follow up if your symptoms persist. Stop your Lipitor during the time that you take your Diflucan.

## 2015-01-19 ENCOUNTER — Encounter: Payer: Self-pay | Admitting: *Deleted

## 2015-01-20 ENCOUNTER — Ambulatory Visit: Payer: Medicare Other | Admitting: Anesthesiology

## 2015-01-20 ENCOUNTER — Encounter: Payer: Self-pay | Admitting: *Deleted

## 2015-01-20 ENCOUNTER — Encounter
Admission: RE | Disposition: A | Discharge status: Home / Self Care | Payer: Self-pay | Source: Ambulatory Visit | Attending: Unknown Physician Specialty

## 2015-01-20 ENCOUNTER — Ambulatory Visit
Admission: RE | Admit: 2015-01-20 | Discharge: 2015-01-20 | Disposition: A | Discharge status: Home / Self Care | Payer: Medicare Other | Source: Ambulatory Visit | Attending: Unknown Physician Specialty | Admitting: Unknown Physician Specialty

## 2015-01-20 DIAGNOSIS — Z1211 Encounter for screening for malignant neoplasm of colon: Secondary | ICD-10-CM | POA: Insufficient documentation

## 2015-01-20 DIAGNOSIS — F418 Other specified anxiety disorders: Secondary | ICD-10-CM | POA: Insufficient documentation

## 2015-01-20 DIAGNOSIS — Z91010 Allergy to peanuts: Secondary | ICD-10-CM | POA: Diagnosis not present

## 2015-01-20 DIAGNOSIS — Z800 Family history of malignant neoplasm of digestive organs: Secondary | ICD-10-CM | POA: Insufficient documentation

## 2015-01-20 DIAGNOSIS — E785 Hyperlipidemia, unspecified: Secondary | ICD-10-CM | POA: Insufficient documentation

## 2015-01-20 DIAGNOSIS — J45909 Unspecified asthma, uncomplicated: Secondary | ICD-10-CM | POA: Diagnosis not present

## 2015-01-20 DIAGNOSIS — Z8371 Family history of colonic polyps: Secondary | ICD-10-CM | POA: Diagnosis not present

## 2015-01-20 DIAGNOSIS — Z882 Allergy status to sulfonamides status: Secondary | ICD-10-CM | POA: Diagnosis not present

## 2015-01-20 DIAGNOSIS — Z91040 Latex allergy status: Secondary | ICD-10-CM | POA: Diagnosis not present

## 2015-01-20 DIAGNOSIS — K219 Gastro-esophageal reflux disease without esophagitis: Secondary | ICD-10-CM | POA: Diagnosis not present

## 2015-01-20 DIAGNOSIS — K640 First degree hemorrhoids: Secondary | ICD-10-CM | POA: Diagnosis not present

## 2015-01-20 DIAGNOSIS — Z86010 Personal history of colonic polyps: Secondary | ICD-10-CM | POA: Diagnosis present

## 2015-01-20 DIAGNOSIS — K5730 Diverticulosis of large intestine without perforation or abscess without bleeding: Secondary | ICD-10-CM | POA: Diagnosis not present

## 2015-01-20 DIAGNOSIS — M1990 Unspecified osteoarthritis, unspecified site: Secondary | ICD-10-CM | POA: Insufficient documentation

## 2015-01-20 DIAGNOSIS — Z79899 Other long term (current) drug therapy: Secondary | ICD-10-CM | POA: Insufficient documentation

## 2015-01-20 DIAGNOSIS — M8580 Other specified disorders of bone density and structure, unspecified site: Secondary | ICD-10-CM | POA: Diagnosis not present

## 2015-01-20 HISTORY — DX: Puckering of macula, right eye: H35.371

## 2015-01-20 HISTORY — DX: Unspecified osteoarthritis, unspecified site: M19.90

## 2015-01-20 HISTORY — DX: Cardiac murmur, unspecified: R01.1

## 2015-01-20 HISTORY — DX: Age-related nuclear cataract, bilateral: H25.13

## 2015-01-20 HISTORY — DX: Urinary tract infection, site not specified: N39.0

## 2015-01-20 HISTORY — DX: Unspecified asthma, uncomplicated: J45.909

## 2015-01-20 HISTORY — PX: COLONOSCOPY WITH PROPOFOL: SHX5780

## 2015-01-20 HISTORY — DX: Cystoid macular degeneration, right eye: H35.351

## 2015-01-20 SURGERY — COLONOSCOPY WITH PROPOFOL
Anesthesia: General

## 2015-01-20 MED ORDER — PROPOFOL 10 MG/ML IV BOLUS
INTRAVENOUS | Status: DC | PRN
  Administered 2015-01-20: 50 mg via INTRAVENOUS

## 2015-01-20 MED ORDER — FENTANYL CITRATE (PF) 100 MCG/2ML IJ SOLN
INTRAMUSCULAR | Status: DC | PRN
  Administered 2015-01-20: 50 ug via INTRAVENOUS

## 2015-01-20 MED ORDER — LIDOCAINE HCL (PF) 2 % IJ SOLN
INTRAMUSCULAR | Status: DC | PRN
  Administered 2015-01-20: 50 mg

## 2015-01-20 MED ORDER — PROPOFOL 500 MG/50ML IV EMUL
INTRAVENOUS | Status: DC | PRN
  Administered 2015-01-20: 100 ug/kg/min via INTRAVENOUS

## 2015-01-20 MED ORDER — SODIUM CHLORIDE 0.9 % IV SOLN
INTRAVENOUS | Status: DC
  Administered 2015-01-20: 08:00:00 via INTRAVENOUS

## 2015-01-20 MED ORDER — MIDAZOLAM HCL 5 MG/5ML IJ SOLN
INTRAMUSCULAR | Status: DC | PRN
  Administered 2015-01-20: 1 mg via INTRAVENOUS

## 2015-01-20 MED ORDER — SODIUM CHLORIDE 0.9 % IV SOLN: INTRAVENOUS | Status: DC

## 2015-01-20 NOTE — Op Note (Signed)
[facility] Gastroenterology Patient Name: [name] Procedure Date: 01/20/2015 8:40 AM MRN: [id number] Account #: [id number] Date of Birth: August 24, 1945 Admit Type: Outpatient Age: 69 Room: [facility] ENDO ROOM 1 Gender: Female Note Status: Finalized Procedure:         Colonoscopy Indications:       High risk colon cancer surveillance: Personal history of                     colonic polyps, Family history of colon cancer in a                     first-degree relative Providers:         Manya Silvas, MD Referring MD:      Leonie Douglas. Doy Hutching, MD (Referring MD) Medicines:         Propofol per Anesthesia Complications:     No immediate complications. Procedure:         Pre-Anesthesia Assessment:                    - After reviewing the risks and benefits, the patient was                     deemed in satisfactory condition to undergo the procedure.                    After obtaining informed consent, the colonoscope was                     passed under direct vision. Throughout the procedure, the                     patient's blood pressure, pulse, and oxygen saturations                     were monitored continuously. The Colonoscope was                     introduced through the anus and advanced to the the cecum,                     identified by appendiceal orifice and ileocecal valve. The                     colonoscopy was performed without difficulty. The patient                     tolerated the procedure well. The quality of the bowel                     preparation was excellent. Findings:      Many medium-mouthed diverticula were found in the sigmoid colon.      Internal hemorrhoids were found during endoscopy. The hemorrhoids were       small and Grade I (internal hemorrhoids that do not prolapse).      The exam was otherwise without abnormality. Impression:        - Diverticulosis in the sigmoid colon.                    - Internal hemorrhoids.            - The examination was otherwise normal.                    -  No specimens collected. Recommendation:    - Repeat colonoscopy in 5 years for surveillance. Manya Silvas, MD 01/20/2015 9:00:49 AM This report has been signed electronically. Number of Addenda: 0 Note Initiated On: 01/20/2015 8:40 AM Scope Withdrawal Time: 0 hours 7 minutes 21 seconds  Total Procedure Duration: 0 hours 16 minutes 6 seconds       [facility]

## 2015-01-20 NOTE — Anesthesia Postprocedure Evaluation (Signed)
Anesthesia Post Note  Patient: [name]  Procedure(s) Performed: Procedure(s) (LRB): COLONOSCOPY WITH PROPOFOL (N/A)  Patient location during evaluation: PACU Anesthesia Type: General Level of consciousness: awake Pain management: pain level controlled Vital Signs Assessment: post-procedure vital signs reviewed and stable Respiratory status: spontaneous breathing Cardiovascular status: stable Anesthetic complications: no    Last Vitals:  Filed Vitals:   01/20/15 0800 01/20/15 0902  BP: 108/70 103/59  Pulse: 83 71  Temp: 36.3 C 35.7 C  Resp: 15 18    Last Pain: There were no vitals filed for this visit.               VAN STAVEREN,Naasia Weilbacher

## 2015-01-20 NOTE — Anesthesia Preprocedure Evaluation (Signed)
Anesthesia Evaluation  Patient identified by MRN, date of birth, ID band Patient awake    Reviewed: Allergy & Precautions, NPO status , Patient's Chart, lab work & pertinent test results  Airway Mallampati: II       Dental  (+) Teeth Intact   Pulmonary asthma ,    breath sounds clear to auscultation       Cardiovascular negative cardio ROS   Rhythm:Regular Rate:Normal     Neuro/Psych    GI/Hepatic Neg liver ROS, GERD  ,  Endo/Other  negative endocrine ROS  Renal/GU negative Renal ROS     Musculoskeletal   Abdominal Normal abdominal exam  (+)   Peds  Hematology negative hematology ROS (+)   Anesthesia Other Findings   Reproductive/Obstetrics                             Anesthesia Physical Anesthesia Plan  ASA: II  Anesthesia Plan: General   Post-op Pain Management:    Induction: Intravenous  Airway Management Planned: Nasal Cannula  Additional Equipment:   Intra-op Plan:   Post-operative Plan:   Informed Consent: I have reviewed the patients History and Physical, chart, labs and discussed the procedure including the risks, benefits and alternatives for the proposed anesthesia with the patient or authorized representative who has indicated his/her understanding and acceptance.     Plan Discussed with: CRNA  Anesthesia Plan Comments:         Anesthesia Quick Evaluation

## 2015-01-20 NOTE — H&P (Signed)
Primary Care Physician:  Idelle Crouch, MD Primary Gastroenterologist:  Dr. Vira Agar  Pre-Procedure History & Physical: HPI:  [name] is a 69 y.o. female is here for an colonoscopy.   Past Medical History  Diagnosis Date  . Esophageal reflux   . Other acute sinusitis   . Other and unspecified hyperlipidemia   . Unspecified asthma(493.90)   . Allergic rhinitis, cause unspecified   . Depression   . Chronic bronchitis   . IBS (irritable bowel syndrome)   . Diverticulosis   . Anxiety disorder   . Hx of adenomatous colonic polyps   . GERD (gastroesophageal reflux disease)   . Toenail fungus   . Osteopenia 07/2010    T score -1.1 FRAX 7.7%/0.5%  . Rectocele   . Retinal hole   . Arthritis   . Asthma without status asthmaticus   . Cystoid macular edema of right eye   . ERM OD (epiretinal membrane, right eye)   . Heart murmur   . Nuclear sclerotic cataract of both eyes   . Recurrent urinary tract infection     Past Surgical History  Procedure Laterality Date  . Tonsillectomy  1950  . Breast biopsy  1976  . Foot surgery      Sewing needle removed  . Cataract extraction      Right eye  . Wisdom tooth extraction    . Colonoscopy    . Esophagogastroduodenoscopy    . Flexible sigmoidoscopy      Prior to Admission medications   Medication Sig Start Date End Date Taking? Authorizing Provider  atorvastatin (LIPITOR) 20 MG tablet Take 10 mg by mouth every other day.    Yes Historical Provider, MD  montelukast (SINGULAIR) 10 MG tablet Take 1 tablet (10 mg total) by mouth at bedtime. 10/20/14  Yes Elsie Stain, MD  naftifine (NAFTIN) 1 % cream Apply topically daily.   Yes Historical Provider, MD  ranitidine (ZANTAC) 150 MG capsule Take 150 mg by mouth 2 (two) times daily.     Yes Historical Provider, MD  triamcinolone cream (KENALOG) 0.1 % Apply 1 application topically 2 (two) times daily.   Yes Historical Provider, MD  Azelastine HCl 0.15 % SOLN USE 2 SPRAYS IN EACH  NOSTRIL ONCE DAILY 12/03/13   Elsie Stain, MD  calcium citrate-vitamin D 200-200 MG-UNIT TABS Take 2 tablets by mouth daily.      Historical Provider, MD  cetirizine (ZYRTEC) 10 MG tablet Take 10 mg by mouth daily. Alternates with allegra, zyrtec    Historical Provider, MD  chlorpheniramine (CHLOR-TRIMETON) 4 MG tablet Take 2 tablets (8 mg total) by mouth at bedtime as needed. 08/27/12   Elsie Stain, MD  fish oil-omega-3 fatty acids 1000 MG capsule Take 1 g by mouth every other day.      Historical Provider, MD  fluconazole (DIFLUCAN) 150 MG tablet Take 1 tablet (150 mg total) by mouth once. 12/27/14   Anastasio Auerbach, MD  Multiple Vitamin (MULTIVITAMIN) tablet Take 1 tablet by mouth daily.      Historical Provider, MD  PROAIR HFA 108 (90 BASE) MCG/ACT inhaler INHALE 2 PUFFS INTO THE LUNGS EVERY 6 (SIX) HOURS AS NEEDED. 11/18/13   Elsie Stain, MD  Probiotic Product (PROBIOTIC PO) Take by mouth.    Historical Provider, MD  VERAMYST 27.5 MCG/SPRAY nasal spray PLACE 2 SPRAYS INTO THE NOSE DAILY.    Elsie Stain, MD    Allergies as of 11/09/2014 - Review Complete  09/22/2014  Allergen Reaction Noted  . Latex Hives 06/26/2012  . Naproxen  01/29/2011  . Peanut oil  07/27/2013  . Sulfonamide derivatives Hives     Family History  Problem Relation Age of Onset  . Colon cancer Father   . Lung cancer Father   . Heart attack Father   . Stomach cancer Father   . Hypertension Father   . Cancer Father     Bladder cancer  . Hypertension Mother   . Osteoporosis Mother   . Stroke Mother   . Liver cancer Maternal Grandmother   . Colon polyps Brother   . Prostate cancer Maternal Uncle   . Breast cancer Paternal Aunt     Age 82's  . Breast cancer Paternal Grandmother     Age 41's  . Diabetes Paternal Grandmother   . Osteoporosis Maternal Aunt   . Lung cancer Paternal Grandfather     Social History   Social History  . Marital Status: Divorced    Spouse Name: N/A  . Number  of Children: N/A  . Years of Education: N/A   Occupational History  . retired Pharmacist, hospital    Social History Main Topics  . Smoking status: Never Smoker   . Smokeless tobacco: Never Used  . Alcohol Use: 0.0 oz/week    0 Standard drinks or equivalent per week     Comment: rare  . Drug Use: No  . Sexual Activity: No     Comment: 1st intercourse 69 yo-Fewer than 5 partners   Other Topics Concern  . Not on file   Social History Narrative    Review of Systems: See HPI, otherwise negative ROS  Physical Exam: BP 108/70 mmHg  Pulse 83  Temp(Src) 97.3 F (36.3 C) (Tympanic)  Resp 15  Ht 5\' 3"  (1.6 m)  Wt 79.379 kg (175 lb)  BMI 31.01 kg/m2  SpO2 97% General:   Alert,  pleasant and cooperative in NAD Head:  Normocephalic and atraumatic. Neck:  Supple; no masses or thyromegaly. Lungs:  Clear throughout to auscultation.    Heart:  Regular rate and rhythm. Abdomen:  Soft, nontender and nondistended. Normal bowel sounds, without guarding, and without rebound.   Neurologic:  Alert and  oriented x4;  grossly normal neurologically.  Impression/Plan: [name] is here for an colonoscopy to be performed for [facility] colon polyps, FH colon cancer in father.  FH colon polyps in family   Risks, benefits, limitations, and alternatives regarding  colonoscopy have been reviewed with the patient.  Questions have been answered.  All parties agreeable.   Gaylyn Cheers, MD  01/20/2015, 8:34 AM

## 2015-01-20 NOTE — Transfer of Care (Signed)
Immediate Anesthesia Transfer of Care Note  Patient: [name]  Procedure(s) Performed: Procedure(s): COLONOSCOPY WITH PROPOFOL (N/A)  Patient Location: PACU  Anesthesia Type:General  Level of Consciousness: awake  Airway & Oxygen Therapy: Patient Spontanous Breathing and Patient connected to nasal cannula oxygen  Post-op Assessment: Report given to RN and Post -op Vital signs reviewed and stable  Post vital signs: Reviewed and stable  Last Vitals:  Filed Vitals:   01/20/15 0800  BP: 108/70  Pulse: 83  Temp: 36.3 C  Resp: 15    Complications: No apparent anesthesia complications

## 2015-01-24 ENCOUNTER — Encounter: Payer: Self-pay | Admitting: Unknown Physician Specialty

## 2015-03-12 LAB — HM HEPATITIS C SCREENING LAB: HM Hepatitis Screen: NEGATIVE

## 2015-05-12 ENCOUNTER — Other Ambulatory Visit: Payer: Self-pay | Admitting: *Deleted

## 2015-05-18 ENCOUNTER — Other Ambulatory Visit: Payer: Self-pay | Admitting: Internal Medicine

## 2015-05-18 DIAGNOSIS — M542 Cervicalgia: Secondary | ICD-10-CM

## 2015-05-18 DIAGNOSIS — R42 Dizziness and giddiness: Secondary | ICD-10-CM

## 2015-05-20 ENCOUNTER — Other Ambulatory Visit: Payer: Self-pay | Admitting: *Deleted

## 2015-05-20 MED ORDER — FLUTICASONE FUROATE 27.5 MCG/SPRAY NA SUSP: NASAL | Status: DC

## 2015-05-23 ENCOUNTER — Ambulatory Visit
Admission: RE | Admit: 2015-05-23 | Discharge: 2015-05-23 | Disposition: A | Discharge status: Home / Self Care | Payer: Medicare Other | Source: Ambulatory Visit | Attending: Internal Medicine | Admitting: Internal Medicine

## 2015-05-23 DIAGNOSIS — R42 Dizziness and giddiness: Secondary | ICD-10-CM | POA: Insufficient documentation

## 2015-05-23 DIAGNOSIS — M542 Cervicalgia: Secondary | ICD-10-CM | POA: Diagnosis not present

## 2015-05-24 ENCOUNTER — Other Ambulatory Visit: Payer: Self-pay | Admitting: Otolaryngology

## 2015-05-24 DIAGNOSIS — R42 Dizziness and giddiness: Secondary | ICD-10-CM

## 2015-05-25 ENCOUNTER — Other Ambulatory Visit: Payer: Self-pay | Admitting: *Deleted

## 2015-05-25 MED ORDER — ALBUTEROL SULFATE HFA 108 (90 BASE) MCG/ACT IN AERS: INHALATION_SPRAY | RESPIRATORY_TRACT | Status: AC

## 2015-05-26 ENCOUNTER — Ambulatory Visit
Admission: RE | Admit: 2015-05-26 | Discharge: 2015-05-26 | Disposition: A | Discharge status: Home / Self Care | Payer: Medicare Other | Source: Ambulatory Visit | Attending: Otolaryngology | Admitting: Otolaryngology

## 2015-05-26 ENCOUNTER — Other Ambulatory Visit: Payer: Self-pay | Admitting: Otolaryngology

## 2015-05-26 DIAGNOSIS — E236 Other disorders of pituitary gland: Secondary | ICD-10-CM | POA: Diagnosis not present

## 2015-05-26 DIAGNOSIS — R221 Localized swelling, mass and lump, neck: Secondary | ICD-10-CM

## 2015-05-26 DIAGNOSIS — R42 Dizziness and giddiness: Secondary | ICD-10-CM

## 2015-05-26 MED ORDER — GADOBENATE DIMEGLUMINE 529 MG/ML IV SOLN
10.0000 mL | Freq: Once | INTRAVENOUS | Status: AC | PRN
  Administered 2015-05-26: 8 mL via INTRAVENOUS

## 2015-09-21 ENCOUNTER — Ambulatory Visit
Admission: RE | Admit: 2015-09-21 | Discharge: 2015-09-21 | Disposition: A | Discharge status: Home / Self Care | Payer: Medicare Other | Source: Ambulatory Visit | Attending: Otolaryngology | Admitting: Otolaryngology

## 2015-09-21 DIAGNOSIS — R221 Localized swelling, mass and lump, neck: Secondary | ICD-10-CM | POA: Insufficient documentation

## 2015-09-26 ENCOUNTER — Ambulatory Visit: Payer: Medicare Other

## 2015-11-21 ENCOUNTER — Other Ambulatory Visit: Payer: Self-pay | Admitting: Gynecology

## 2015-11-21 ENCOUNTER — Encounter: Payer: Self-pay | Admitting: Gynecology

## 2015-11-21 ENCOUNTER — Ambulatory Visit (INDEPENDENT_AMBULATORY_CARE_PROVIDER_SITE_OTHER): Payer: Medicare Other | Admitting: Gynecology

## 2015-11-21 VITALS — BP 118/74 | Ht 63.0 in | Wt 171.0 lb

## 2015-11-21 DIAGNOSIS — N8189 Other female genital prolapse: Secondary | ICD-10-CM

## 2015-11-21 DIAGNOSIS — N952 Postmenopausal atrophic vaginitis: Secondary | ICD-10-CM

## 2015-11-21 DIAGNOSIS — M8580 Other specified disorders of bone density and structure, unspecified site: Secondary | ICD-10-CM

## 2015-11-21 DIAGNOSIS — Z01411 Encounter for gynecological examination (general) (routine) with abnormal findings: Secondary | ICD-10-CM

## 2015-11-21 DIAGNOSIS — Z1231 Encounter for screening mammogram for malignant neoplasm of breast: Secondary | ICD-10-CM

## 2015-11-21 NOTE — Progress Notes (Signed)
    Kewanna Braden Jimmerson [birth year] [id number]        70 y.o.  G2P2002  for annual exam.  Several issues noted below.  Past medical history,surgical history, problem list, medications, allergies, family history and social history were all reviewed and documented as reviewed in the EPIC chart.  ROS:  Performed with pertinent positives and negatives included in the history, assessment and plan.   Additional significant findings :  None   Exam: [name] assistant Vitals:   11/21/15 1051  BP: 118/74  Weight: 171 lb (77.6 kg)  Height: 5\' 3"  (1.6 m)   Body mass index is 30.29 kg/m.  General appearance:  Normal affect, orientation and appearance. Skin: Grossly normal HEENT: Without gross lesions.  No cervical or supraclavicular adenopathy. Thyroid normal.  Lungs:  Clear without wheezing, rales or rhonchi Cardiac: RR, without RMG Abdominal:  Soft, nontender, without masses, guarding, rebound, organomegaly or hernia Breasts:  Examined lying and sitting without masses, retractions, discharge or axillary adenopathy. Pelvic:  Ext, BUS, Vagina With atrophic changes.  First degree cystocele, first-degree uterine prolapse, second-degree rectocele.  Cervix atrophic  Uterus anteverted, normal size, shape and contour, midline and mobile nontender   Adnexa without masses or tenderness    Anus and perineum normal   Rectovaginal normal sphincter tone without palpated masses or tenderness.    Assessment/Plan:  69 y.o. [id number] female for annual exam.   1. Postmenopausal/atrophic genital changes. No significant hot flushes, night sweats, vaginal dryness or any vaginal bleeding. Continue to monitor and report any issues or bleeding. 2. Pelvic relaxation with second-degree rectocele, mild cystocele and mild uterine prolapse. Does appear to have some stool trapping on occasion but not overly bothersome to the patient. Discussed treatment options to include surgery and patient is not interested at this  time. We'll continue to monitor and report any issues. 3. Mammography due now and I reminded her to schedule this and she agrees to call and schedule. SBE monthly reviewed. 4. Osteopenia. DEXA 2012 T score -1.1 FRAX 7.7%/0.5%. Plan follow up DEXA now at 5 year interval and she agrees to call schedule. Increased vitamin D. 5. Colonoscopy 2016. Repeat at their recommended interval. 6. Pap smear 2015. No Pap smear done today. No history of significant abnormal Pap smears. Options to stop screening altogether per current screening guidelines based on age versus less frequent screening intervals reviewed. Will readdress on annual basis. 7. Health maintenance. No routine lab work done as patient reports this done elsewhere. Follow up for bone density otherwise 1 year, sooner as needed.   [name], 11:08 AM 11/21/2015

## 2015-11-21 NOTE — Addendum Note (Signed)
Addended by: Nelva Nay on: 11/21/2015 11:36 AM   Modules accepted: Orders

## 2015-11-21 NOTE — Patient Instructions (Signed)
Call to Schedule your mammogram  Facilities in [place]: 1)  [facility]. [phone] Phone: [phone]     Mammogram A mammogram is an X-ray test to find changes in a woman's breast. You should get a mammogram if:  You are 69 years of age or older  You have risk factors.   Your doctor recommends that you have one.  BEFORE THE TEST  Do not schedule the test the week before your period, especially if your breasts are sore during this time.  On the day of your mammogram:  Wash your breasts and armpits well. After washing, do not put on any deodorant or talcum powder on until after your test.   Eat and drink as you usually do.   Take your medicines as usual.   If you are diabetic and take insulin, make sure you:   Eat before coming for your test.   Take your insulin as usual.   If you cannot keep your appointment, call before the appointment to cancel. Schedule another appointment.  TEST  You will need to undress from the waist up. You will put on a hospital gown.   Your breast will be put on the mammogram machine, and it will press firmly on your breast with a piece of plastic called a compression paddle. This will make your breast flatter so that the machine can X-ray all parts of your breast.   Both breasts will be X-rayed. Each breast will be X-rayed from above and from the side. An X-ray might need to be taken again if the picture is not good enough.   The mammogram will last about 15 to 30 minutes.  AFTER THE TEST Finding out the results of your test Ask when your test results will be ready. Make sure you get your test results.  Document Released: 05/04/2008 Document Revised: 01/25/2011 Document Reviewed: 05/04/2008 [facility] Patient Information 2012 [place].

## 2015-11-22 LAB — URINALYSIS W MICROSCOPIC + REFLEX CULTURE
Bacteria, UA: NONE SEEN [HPF]
Bilirubin Urine: NEGATIVE
CASTS: NONE SEEN [LPF]
CRYSTALS: NONE SEEN [HPF]
Glucose, UA: NEGATIVE
Hgb urine dipstick: NEGATIVE
Ketones, ur: NEGATIVE
Leukocytes, UA: NEGATIVE
NITRITE: NEGATIVE
PH: 7.5 (ref 5.0–8.0)
Protein, ur: NEGATIVE
RBC / HPF: NONE SEEN RBC/HPF (ref <=2)
SPECIFIC GRAVITY, URINE: 1.005 (ref 1.001–1.035)
Squamous Epithelial / LPF: NONE SEEN [HPF] (ref <=5)
WBC, UA: NONE SEEN WBC/HPF (ref <=5)
YEAST: NONE SEEN [HPF]

## 2015-12-15 ENCOUNTER — Ambulatory Visit
Admission: RE | Admit: 2015-12-15 | Discharge: 2015-12-15 | Disposition: A | Discharge status: Home / Self Care | Payer: Medicare Other | Source: Ambulatory Visit | Attending: Gynecology | Admitting: Gynecology

## 2015-12-15 DIAGNOSIS — Z1231 Encounter for screening mammogram for malignant neoplasm of breast: Secondary | ICD-10-CM

## 2015-12-30 ENCOUNTER — Other Ambulatory Visit: Payer: Self-pay | Admitting: Physician Assistant

## 2015-12-30 ENCOUNTER — Ambulatory Visit
Admission: RE | Admit: 2015-12-30 | Discharge: 2015-12-30 | Disposition: A | Discharge status: Home / Self Care | Payer: Medicare Other | Source: Ambulatory Visit | Attending: Physician Assistant | Admitting: Physician Assistant

## 2015-12-30 DIAGNOSIS — M79605 Pain in left leg: Secondary | ICD-10-CM | POA: Diagnosis not present

## 2016-01-26 ENCOUNTER — Ambulatory Visit (INDEPENDENT_AMBULATORY_CARE_PROVIDER_SITE_OTHER): Payer: Medicare Other

## 2016-01-26 ENCOUNTER — Encounter: Payer: Self-pay | Admitting: Gynecology

## 2016-01-26 ENCOUNTER — Other Ambulatory Visit: Payer: Self-pay | Admitting: Gynecology

## 2016-01-26 DIAGNOSIS — Z13820 Encounter for screening for osteoporosis: Secondary | ICD-10-CM

## 2016-01-26 DIAGNOSIS — M899 Disorder of bone, unspecified: Secondary | ICD-10-CM | POA: Diagnosis not present

## 2016-01-26 DIAGNOSIS — M8589 Other specified disorders of bone density and structure, multiple sites: Secondary | ICD-10-CM | POA: Diagnosis not present

## 2016-01-26 DIAGNOSIS — M8580 Other specified disorders of bone density and structure, unspecified site: Secondary | ICD-10-CM

## 2016-02-20 ENCOUNTER — Encounter: Payer: Self-pay | Admitting: Gynecology

## 2016-03-05 ENCOUNTER — Telehealth: Payer: Self-pay | Admitting: *Deleted

## 2016-03-05 NOTE — Telephone Encounter (Signed)
Pt informed with the below note. 

## 2016-03-05 NOTE — Telephone Encounter (Signed)
Patient called to receive dexa results, states she never received any results. Dexa results said letter, but no letter in epic. Please advise

## 2016-03-05 NOTE — Telephone Encounter (Signed)
Not sure what happened as far as the letter but tell patient bone density showed a little bit of osteopenia completely stable from her prior study 5 years ago. Recommend repeat at 3-5 year interval.

## 2016-12-07 ENCOUNTER — Other Ambulatory Visit: Payer: Self-pay | Admitting: Gynecology

## 2016-12-07 DIAGNOSIS — Z1231 Encounter for screening mammogram for malignant neoplasm of breast: Secondary | ICD-10-CM

## 2016-12-20 ENCOUNTER — Encounter: Payer: Self-pay | Admitting: Gynecology

## 2016-12-20 ENCOUNTER — Ambulatory Visit (INDEPENDENT_AMBULATORY_CARE_PROVIDER_SITE_OTHER): Payer: Medicare Other | Admitting: Gynecology

## 2016-12-20 VITALS — BP 120/74 | Ht 63.0 in | Wt 173.0 lb

## 2016-12-20 DIAGNOSIS — Z01411 Encounter for gynecological examination (general) (routine) with abnormal findings: Secondary | ICD-10-CM | POA: Diagnosis not present

## 2016-12-20 DIAGNOSIS — M8580 Other specified disorders of bone density and structure, unspecified site: Secondary | ICD-10-CM

## 2016-12-20 DIAGNOSIS — N8189 Other female genital prolapse: Secondary | ICD-10-CM | POA: Diagnosis not present

## 2016-12-20 DIAGNOSIS — Z124 Encounter for screening for malignant neoplasm of cervix: Secondary | ICD-10-CM

## 2016-12-20 DIAGNOSIS — N952 Postmenopausal atrophic vaginitis: Secondary | ICD-10-CM

## 2016-12-20 NOTE — Progress Notes (Signed)
    [name] 1945/04/24 [id number]        71 y.o.  [id number] for annual gynecologic exam.   Past medical history,surgical history, problem list, medications, allergies, family history and social history were all reviewed and documented as reviewed in the EPIC chart.  ROS:  Performed with pertinent positives and negatives included in the history, assessment and plan.   Additional significant findings : None   Exam: [name] assistant Vitals:   12/20/16 1140  BP: 120/74  Weight: 173 lb (78.5 kg)  Height: 5\' 3"  (1.6 m)   Body mass index is 30.65 kg/m.  General appearance:  Normal affect, orientation and appearance. Skin: Grossly normal HEENT: Without gross lesions.  No cervical or supraclavicular adenopathy. Thyroid normal.  Lungs:  Clear without wheezing, rales or rhonchi Cardiac: RR, without RMG Abdominal:  Soft, nontender, without masses, guarding, rebound, organomegaly or hernia Breasts:  Examined lying and sitting without masses, retractions, discharge or axillary adenopathy. Pelvic:  Ext, BUS, Vagina: With atrophic changes.  Second-degree cystocele.  First-degree uterine prolapse.  Second-degree rectocele  Cervix: With atrophic changes.  Pap smear done  Uterus: Anteverted, normal size, shape and contour, midline and mobile nontender   Adnexa: Without masses or tenderness    Anus and perineum: Normal   Rectovaginal: Normal sphincter tone without palpated masses or tenderness.    Assessment/Plan:  71 y.o. [id number] female for annual gynecologic exam.   1. Postmenopausal/atrophic genital changes.  No significant hot flushes, night sweats, vaginal dryness or any bleeding.  Continue to monitor and report any issues or bleeding. 2. Pelvic relaxation with cystocele/rectocele/uterine prolapse.  Patient relatively asymptomatic.  Does have occasional pressure symptoms but otherwise doing well.  I again reviewed options to include observation, pessary or surgery.  The pros and  cons of each choice discussed.  At this point the patient is not interested in any intervention.  She will follow-up if she decides otherwise. 3. Mammography due now and patient will schedule.  Breast exam normal today. 4. Pap smear 2015.  Pap smear done today.  No history of abnormal Pap smears.  Options to stop screening based on age and current screening guidelines reviewed. 5. Colonoscopy 2016.  Repeat at their recommended interval. 6. Osteopenia.  DEXA 2017 T score -1.7 FRAX 9.5% / 1.6%.  Stable from prior DEXA.  Plan repeat DEXA next year or 2-year interval.  Increase calcium vitamin D. 7. Health maintenance.  No routine lab work done as patient reports this done elsewhere.  Follow-up in 1 year, sooner as needed.   [name], 12:17 PM 12/20/2016

## 2016-12-20 NOTE — Patient Instructions (Signed)
Follow-up in 1 year for annual exam, sooner if any issues. 

## 2016-12-20 NOTE — Addendum Note (Signed)
Addended by: Nelva Nay on: 12/20/2016 12:26 PM   Modules accepted: Orders

## 2016-12-24 LAB — PAP IG W/ RFLX HPV ASCU

## 2016-12-27 ENCOUNTER — Ambulatory Visit
Admission: RE | Admit: 2016-12-27 | Discharge: 2016-12-27 | Disposition: A | Discharge status: Home / Self Care | Payer: Medicare Other | Source: Ambulatory Visit | Attending: Gynecology | Admitting: Gynecology

## 2016-12-27 DIAGNOSIS — Z1231 Encounter for screening mammogram for malignant neoplasm of breast: Secondary | ICD-10-CM

## 2017-07-31 ENCOUNTER — Other Ambulatory Visit: Payer: Self-pay | Admitting: Internal Medicine

## 2017-07-31 DIAGNOSIS — R1032 Left lower quadrant pain: Secondary | ICD-10-CM

## 2017-12-09 ENCOUNTER — Other Ambulatory Visit: Payer: Self-pay | Admitting: Gynecology

## 2017-12-09 DIAGNOSIS — Z1231 Encounter for screening mammogram for malignant neoplasm of breast: Secondary | ICD-10-CM

## 2017-12-24 LAB — CBC AND DIFFERENTIAL
HCT: 40 (ref 36–46)
Hemoglobin: 12.9 (ref 12.0–16.0)
Platelets: 211 (ref 150–399)
WBC: 6.1

## 2017-12-24 LAB — LIPID PANEL
Cholesterol: 221 — AB (ref 0–200)
HDL: 64 (ref 35–70)
LDL Cholesterol: 139
Triglycerides: 86 (ref 40–160)

## 2017-12-24 LAB — BASIC METABOLIC PANEL
BUN: 9 (ref 4–21)
Creatinine: 0.7 (ref <=1.1)
Glucose: 102
Potassium: 4.2 (ref 3.4–5.3)
Sodium: 143 (ref 137–147)

## 2017-12-24 LAB — TSH: TSH: 2.78 (ref <=5.90)

## 2017-12-24 LAB — HEPATIC FUNCTION PANEL
ALT: 15 (ref 7–35)
AST: 18 (ref 13–35)
Alkaline Phosphatase: 65 (ref 25–125)

## 2017-12-24 LAB — HEMOGLOBIN A1C: Hemoglobin A1C: 6

## 2018-01-23 ENCOUNTER — Ambulatory Visit
Admission: RE | Admit: 2018-01-23 | Discharge: 2018-01-23 | Disposition: A | Discharge status: Home / Self Care | Payer: Medicare Other | Source: Ambulatory Visit | Attending: Gynecology | Admitting: Gynecology

## 2018-01-23 DIAGNOSIS — Z1231 Encounter for screening mammogram for malignant neoplasm of breast: Secondary | ICD-10-CM

## 2018-02-04 ENCOUNTER — Ambulatory Visit (INDEPENDENT_AMBULATORY_CARE_PROVIDER_SITE_OTHER): Payer: Medicare Other | Admitting: Gynecology

## 2018-02-04 ENCOUNTER — Encounter: Payer: Self-pay | Admitting: Gynecology

## 2018-02-04 VITALS — BP 124/80 | Ht 64.0 in | Wt 174.0 lb

## 2018-02-04 DIAGNOSIS — N952 Postmenopausal atrophic vaginitis: Secondary | ICD-10-CM | POA: Diagnosis not present

## 2018-02-04 DIAGNOSIS — Z01419 Encounter for gynecological examination (general) (routine) without abnormal findings: Secondary | ICD-10-CM | POA: Diagnosis not present

## 2018-02-04 DIAGNOSIS — M8580 Other specified disorders of bone density and structure, unspecified site: Secondary | ICD-10-CM | POA: Diagnosis not present

## 2018-02-04 NOTE — Progress Notes (Signed)
    [name] 1945/11/10 [id number]        72 y.o.  [id number] for annual gynecologic exam.  Doing well without gynecologic complaints  Past medical history,surgical history, problem list, medications, allergies, family history and social history were all reviewed and documented as reviewed in the EPIC chart.  ROS:  Performed with pertinent positives and negatives included in the history, assessment and plan.   Additional significant findings : None   Exam: [name] assistant Vitals:   02/04/18 1059  BP: 124/80  Weight: 174 lb (78.9 kg)  Height: 5\' 4"  (1.626 m)   Body mass index is 29.87 kg/m.  General appearance:  Normal affect, orientation and appearance. Skin: Grossly normal HEENT: Without gross lesions.  No cervical or supraclavicular adenopathy. Thyroid normal.  Lungs:  Clear without wheezing, rales or rhonchi Cardiac: RR, without RMG Abdominal:  Soft, nontender, without masses, guarding, rebound, organomegaly or hernia Breasts:  Examined lying and sitting without masses, retractions, discharge or axillary adenopathy. Pelvic:  Ext, BUS, Vagina: With atrophic changes.  Second-degree cystocele.  First-degree uterine prolapse.  Second-degree rectocele  Cervix: With atrophic changes  Uterus: Anteverted, normal size, shape and contour, midline and mobile nontender   Adnexa: Without masses or tenderness    Anus and perineum: Normal   Rectovaginal: Normal sphincter tone without palpated masses or tenderness.    Assessment/Plan:  72 y.o. G2P2002 female for annual gynecologic exam.   1. Postmenopausal.  No significant menopausal symptoms or any vaginal bleeding. 2. Colonoscopy 2016.  Repeat at their recommended interval. 3. Mammography earlier this month.  Breast exam normal today. 4. Osteopenia.  DEXA 2017 T score -1.7 FRAX 9% / 1.6%.  Recommend follow-up DEXA beginning of this coming year and she agrees to schedule. 5. Pap smear 2018.  No Pap smear done today.  No  history of abnormal Pap smears. 6. Health maintenance.  No routine lab work done as patient does this elsewhere.  Follow-up 1 year, sooner as needed.   [name], 11:29 AM 02/04/2018

## 2018-02-04 NOTE — Patient Instructions (Signed)
Followup for bone density as scheduled. 

## 2018-11-26 ENCOUNTER — Encounter: Payer: Self-pay | Admitting: Gynecology

## 2018-11-28 ENCOUNTER — Ambulatory Visit (INDEPENDENT_AMBULATORY_CARE_PROVIDER_SITE_OTHER): Payer: Medicare Other | Admitting: Family Medicine

## 2018-11-28 ENCOUNTER — Encounter: Payer: Self-pay | Admitting: Family Medicine

## 2018-11-28 ENCOUNTER — Other Ambulatory Visit: Payer: Self-pay

## 2018-11-28 DIAGNOSIS — K219 Gastro-esophageal reflux disease without esophagitis: Secondary | ICD-10-CM | POA: Diagnosis not present

## 2018-11-28 DIAGNOSIS — M8589 Other specified disorders of bone density and structure, multiple sites: Secondary | ICD-10-CM | POA: Diagnosis not present

## 2018-11-28 DIAGNOSIS — E782 Mixed hyperlipidemia: Secondary | ICD-10-CM

## 2018-11-28 DIAGNOSIS — J4530 Mild persistent asthma, uncomplicated: Secondary | ICD-10-CM

## 2018-11-28 DIAGNOSIS — Z86018 Personal history of other benign neoplasm: Secondary | ICD-10-CM

## 2018-11-28 DIAGNOSIS — L851 Acquired keratosis [keratoderma] palmaris et plantaris: Secondary | ICD-10-CM | POA: Insufficient documentation

## 2018-11-28 MED ORDER — MONTELUKAST SODIUM 10 MG PO TABS: 10.0000 mg | ORAL_TABLET | Freq: Every day | ORAL | 2 refills | Status: DC

## 2018-11-28 NOTE — Assessment & Plan Note (Signed)
Last DEXA scan in 2017 Plan to monitor this about every 2 years We will order next DEXA scan at next physical as she is currently postponing screening test due to the pandemic

## 2018-11-28 NOTE — Assessment & Plan Note (Signed)
Diagnosed in the mid 1980s, but resolved without intervention She is asymptomatic at this time

## 2018-11-28 NOTE — Progress Notes (Signed)
Patient: [name], Female    DOB: 1946-01-26, 73 y.o.   MRN: [id number] Visit Date: 11/28/2018  Today's Provider: Lavon Paganini, MD   Chief Complaint  Patient presents with  . Establish Care   Subjective:    I, Porsha McClurkin, CMA, am acting as a scribe for Lavon Paganini, MD.    Virtual Visit via Video Note  I connected with [name] on 11/28/18 at  9:00 AM EDT by a video enabled telemedicine application and verified that I am speaking with the correct person using two identifiers.   Patient location: home Provider location: [place] involved in the visit: patient, provider    I discussed the limitations of evaluation and management by telemedicine and the availability of in person appointments. The patient expressed understanding and agreed to proceed.   New Patient:  [name] is a 73 y.o. female who presents today to Establish Care.  She feels well. She reports exercising includes walking. She reports she is sleeping well.  ----------------------------------------------------------------- Last pap:12/20/2016 -has never had an abnormal Pap smear.  Has had few sexual partners and is not currently sexually active.  She is not sure why she continue to get Pap smears after age 61. Last mammogram:01/23/2018 -she is sees annually at the breast center in [place].  She will likely delay her next mammogram due to the current pandemic Last colonoscopy:01/20/2015 -repeat in 5 years given her family history of colon cancer  States that she was due for CPE in May, but she postponed due to [place].  Patient has asthma with history of recurrent bronchitis.  She recently switched to [facility] for her controller med.  She does use albuterol infrequently, as needed.  She is getting her flu shot next week.  She is currently asymptomatic.  She also has allergic rhinitis and some atopic dermatitis related to this.  She takes Zyrtec and Singulair.   She uses azelastine for her nasal symptoms.  History of retinal hole, ERM OD, cataracts: She had surgery with a [facility] retinal specialist and she is followed for regular eye exams with [facility].    HLD:  Taking statin regularly.  Has family history of heart disease, but no personal history of heart disease.  Denies any chest pain, shortness of breath.  GERD: Fairly well controlled.  Was previously taking Zantac every other day for maintenance therapy, but when this was recalled, she switched to Pepcid.  Anxiety: Much better since retiring.  She is not currently on any medications for anxiety.  She describes herself as a generally nervous person.  Osteopenia: Talking citracal 1300 mg daily.  Tries to eat regular exercise.  Her mother and maternal grandmother both had osteoporosis.  No current or recent fractures.  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  She was told that there were surgeries, but she was not able to do this as she lives alone and would not be able to be off of her feet for that length of time.  She also previously had a foot surgery when a needle was embedded in her foot and believes that scar tissue contributes to this as well.  She states that it can be painful to walk and this does affect her gait.  History of pituitary adenoma in 1984. Stopped growing.  It was monitored for many years with repeat MRIs.  She denies any recent vision changes, headaches.   Review of Systems  Constitutional: Negative.   HENT:  Negative.   Eyes: Negative.   Respiratory: Negative.   Cardiovascular: Negative.   Gastrointestinal: Negative.   Endocrine: Negative.   Genitourinary: Negative.   Musculoskeletal: Negative.   Skin: Negative.   Allergic/Immunologic: Negative.   Neurological: Negative.   Hematological: Negative.   Psychiatric/Behavioral: Negative.     Social History      She  reports that she has never smoked. She has never used smokeless tobacco. She  reports current alcohol use. She reports that she does not use drugs.       Social History   Socioeconomic History  . Marital status: Divorced    Spouse name: Not on file  . Number of children: Not on file  . Years of education: Not on file  . Highest education level: Not on file  Occupational History  . Occupation: retired Tour manager  . Financial resource strain: Not on file  . Food insecurity    Worry: Not on file    Inability: Not on file  . Transportation needs    Medical: Not on file    Non-medical: Not on file  Tobacco Use  . Smoking status: Never Smoker  . Smokeless tobacco: Never Used  Substance and Sexual Activity  . Alcohol use: Yes    Alcohol/week: 0.0 standard drinks    Comment: rare  . Drug use: No  . Sexual activity: Never    Birth control/protection: Post-menopausal    Comment: 1st intercourse 73 yo-Fewer than 5 partners  Lifestyle  . Physical activity    Days per week: Not on file    Minutes per session: Not on file  . Stress: Not on file  Relationships  . Social Herbalist on phone: Not on file    Gets together: Not on file    Attends religious service: Not on file    Active member of club or organization: Not on file    Attends meetings of clubs or organizations: Not on file    Relationship status: Not on file  Other Topics Concern  . Not on file  Social History Narrative  . Not on file    Past Medical History:  Diagnosis Date  . Allergic rhinitis, cause unspecified   . Anxiety disorder   . Arthritis   . Asthma without status asthmaticus   . Chronic bronchitis   . Cystoid macular edema of right eye   . Depression   . Diverticulosis   . ERM OD (epiretinal membrane, right eye)   . Esophageal reflux   . GERD (gastroesophageal reflux disease)   . Heart murmur   . Hx of adenomatous colonic polyps   . IBS (irritable bowel syndrome)   . Nuclear sclerotic cataract of both eyes   . Osteopenia 01/2016   T score -1.7 FRAX  9.5%/1.6% stable from prior DEXA  . Other acute sinusitis   . Other and unspecified hyperlipidemia   . Rectocele   . Recurrent urinary tract infection   . Retinal hole   . Toenail fungus   . Unspecified asthma(493.90)      Patient Active Problem List   Diagnosis Date Noted  . Pituitary adenoma ([place]) 12/09/2013  . PVD (posterior vitreous detachment), bilateral 12/09/2013  . Rectocele   . Asthma 07/02/2013  . GERD (gastroesophageal reflux disease)   . Osteopenia 07/21/2010  . PERSONAL HX COLONIC POLYPS 07/05/2008  . HYPERLIPIDEMIA 01/03/2007  . ALLERGIC RHINITIS 01/03/2007  . Mild persistent asthma with allergic rhinitis without complication [id number]  Past Surgical History:  Procedure Laterality Date  . BREAST BIOPSY  1976  . CATARACT EXTRACTION     Right eye  . COLONOSCOPY    . COLONOSCOPY WITH PROPOFOL N/A 01/20/2015   Procedure: COLONOSCOPY WITH PROPOFOL;  Surgeon: Manya Silvas, MD;  Location[facility] ENDOSCOPY;  Service: Endoscopy;  Laterality: N/A;  . ESOPHAGOGASTRODUODENOSCOPY    . FLEXIBLE SIGMOIDOSCOPY    . FOOT SURGERY     Sewing needle removed  . TONSILLECTOMY  1950  . WISDOM TOOTH EXTRACTION      Family History        Family Status  Relation Name Status  . Father  (Not Specified)  . Mother  (Not Specified)  . MGM  (Not Specified)  . Brother  (Not Specified)  . Mat Uncle  (Not Specified)  . Ethlyn Daniels  (Not Specified)  . PGM  (Not Specified)  . Mat Aunt  (Not Specified)  . PGF  (Not Specified)  . Sister  (Not Specified)  . Other Nephew died 06/10/17 Alive        Her family history includes Breast cancer in her paternal aunt and paternal grandmother; Cancer in her father; Colon cancer in her father and another family member; Colon polyps in her brother and sister; Diabetes in her paternal grandmother; Heart attack in her father; Hypertension in her father and mother; Liver cancer in her maternal grandmother; Lung cancer in her father and paternal  grandfather; Osteoporosis in her maternal aunt and mother; Prostate cancer in her maternal uncle; Stomach cancer in her father; Stroke in her mother.      Allergies  Allergen Reactions  . Latex Hives  . Naproxen     Ulcers per pt  . Peanut Oil     Other reaction(s): Other (See Comments) Erythema and hot sensation  . Sulfonamide Derivatives Hives     Current Outpatient Medications:  .  albuterol (PROAIR HFA) 108 (90 Base) MCG/ACT inhaler, INHALE 2 PUFFS INTO THE LUNGS EVERY 6 (SIX) HOURS AS NEEDED., Disp: 8.5 each, Rfl: 0 .  atorvastatin (LIPITOR) 20 MG tablet, Take 10 mg by mouth every other day. , Disp: , Rfl:  .  Azelastine HCl 0.15 % SOLN, USE 2 SPRAYS IN EACH NOSTRIL ONCE DAILY, Disp: 30 mL, Rfl: 5 .  calcium citrate-vitamin D 200-200 MG-UNIT TABS, Take 2 tablets by mouth daily.  , Disp: , Rfl:  .  cetirizine (ZYRTEC) 10 MG tablet, Take 10 mg by mouth daily. Alternates with allegra, zyrtec, Disp: , Rfl:  .  chlorpheniramine (CHLOR-TRIMETON) 4 MG tablet, Take 2 tablets (8 mg total) by mouth at bedtime as needed., Disp: 14 tablet, Rfl:  .  fish oil-omega-3 fatty acids 1000 MG capsule, Take 1 g by mouth every other day.  , Disp: , Rfl:  .  fluticasone furoate-vilanterol (BREO ELLIPTA) 100-25 MCG/INH AEPB, Inhale 1 puff into the lungs daily., Disp: , Rfl:  .  montelukast (SINGULAIR) 10 MG tablet, Take 1 tablet (10 mg total) by mouth at bedtime., Disp: 30 tablet, Rfl: 2 .  Multiple Vitamin (MULTIVITAMIN) tablet, Take 1 tablet by mouth daily.  , Disp: , Rfl:  .  naftifine (NAFTIN) 1 % cream, Apply topically daily., Disp: , Rfl:  .  triamcinolone cream (KENALOG) 0.1 %, Apply 1 application topically 2 (two) times daily., Disp: , Rfl:    Patient Care Team: Idelle Crouch, MD as PCP - General (Unknown Physician Specialty) Elsie Stain, MD (Pulmonary Disease)    Objective:  Vitals: There were no vitals taken for this visit.  There were no vitals filed for this visit.    Physical Exam Constitutional:      General: She is not in acute distress.    Appearance: Normal appearance.  Pulmonary:     Effort: Pulmonary effort is normal. No respiratory distress.  Neurological:     Mental Status: She is alert and oriented to person, place, and time.  Psychiatric:        Mood and Affect: Mood normal.        Behavior: Behavior normal.      Depression Screen PHQ 2/9 Scores 11/28/2018  PHQ - 2 Score 0  PHQ- 9 Score 4       Assessment & Plan:   I discussed the assessment and treatment plan with the patient. The patient was provided an opportunity to ask questions and all were answered. The patient agreed with the plan and demonstrated an understanding of the instructions.   The patient was advised to call back or seek an in-person evaluation if the symptoms worsen or if the condition fails to improve as anticipated.  I provided >45 minutes of non-face-to-face time during this encounter.    Establish Care  Exercise Activities and Dietary recommendations Goals   None     Immunization History  Administered Date(s) Administered  . Influenza Split 10/21/2011, 11/19/2012  . Influenza Whole 11/07/2007, 11/16/2010  . Influenza,inj,Quad PF,6+ Mos 11/12/2013  . Pneumococcal Polysaccharide-23 11/29/2009  . Td 04/17/2018  . Zoster 11/19/2009  . Zoster Recombinat (Shingrix) 08/08/2016, 11/07/2016    Health Maintenance  Topic Date Due  . Hepatitis C Screening  Dec 01, 1945  . PNA vac Low Risk Adult (1 of 2 - PCV13) 02/23/2011  . INFLUENZA VACCINE  09/20/2018  . PAP SMEAR-Modifier  12/21/2018  . MAMMOGRAM  01/24/2019  . COLONOSCOPY  01/19/2025  . TETANUS/TDAP  04/17/2028  . DEXA SCAN  Completed     Discussed health benefits of physical activity, and encouraged her to engage in regular exercise appropriate for her age and condition.    Scheduled for flu shot next week --------------------------------------------------------------------  Problem  List Items Addressed This Visit      Respiratory   Mild persistent asthma with allergic rhinitis without complication    Chronic and fairly well controlled No signs of current exacerbation Continue Breo and albuterol as needed Continue Singulair, Zyrtec, azelastine Discussed return precautions      Relevant Medications   fluticasone furoate-vilanterol (BREO ELLIPTA) 100-25 MCG/INH AEPB     Digestive   GERD (gastroesophageal reflux disease) - Primary    Fairly well-controlled Discussed that if she was previously controlled on Zantac every other day, it would be reasonable to take Pepcid every other day Discussed as needed use of Tums      Relevant Medications   famotidine (PEPCID) 20 MG tablet     Musculoskeletal and Integument   Osteopenia    Last DEXA scan in 2017 Plan to monitor this about every 2 years We will order next DEXA scan at next physical as she is currently postponing screening test due to the pandemic      Acquired plantar keratoderma    Has previously seen podiatry and discuss treatment options She does not believe she is a good surgical candidate for this, however, as she lives alone and would not be able to be off of her feet for the length of time needed        Other   Hyperlipidemia  Reviewed last lipid panel with patient Continue fish oil and atorvastatin Recheck lipid panel at next physical      Relevant Medications   aspirin EC 81 MG tablet   History of pituitary adenoma    Diagnosed in the mid 1980s, but resolved without intervention She is asymptomatic at this time          Return in about 6 months (around 05/29/2019) for CPE and 1 month for virtual AWV.   The entirety of the information documented in the History of Present Illness, Review of Systems and Physical Exam were personally obtained by me. Portions of this information were initially documented by [facility], CMA and reviewed by me for thoroughness and accuracy.     , Dionne Bucy, MD MPH [facility]

## 2018-11-28 NOTE — Assessment & Plan Note (Signed)
Has previously seen podiatry and discuss treatment options She does not believe she is a good surgical candidate for this, however, as she lives alone and would not be able to be off of her feet for the length of time needed

## 2018-11-28 NOTE — Telephone Encounter (Signed)
Patient forgot to mention that she needs refills on Singulair. Please refill. [facility] [address]. Thanks!

## 2018-11-28 NOTE — Assessment & Plan Note (Signed)
Chronic and fairly well controlled No signs of current exacerbation Continue Breo and albuterol as needed Continue Singulair, Zyrtec, azelastine Discussed return precautions

## 2018-11-28 NOTE — Assessment & Plan Note (Addendum)
Reviewed last lipid panel with patient Continue fish oil and atorvastatin Recheck lipid panel at next physical

## 2018-11-28 NOTE — Assessment & Plan Note (Signed)
Fairly well-controlled Discussed that if she was previously controlled on Zantac every other day, it would be reasonable to take Pepcid every other day Discussed as needed use of Tums

## 2018-11-28 NOTE — Patient Instructions (Signed)
Preventive Care 72 Years and Older, Female Preventive care refers to lifestyle choices and visits with your health care provider that can promote health and wellness. This includes:  A yearly physical exam. This is also called an annual well check.  Regular dental and eye exams.  Immunizations.  Screening for certain conditions.  Healthy lifestyle choices, such as diet and exercise. What can I expect for my preventive care visit? Physical exam Your health care provider will check:  Height and weight. These may be used to calculate body mass index (BMI), which is a measurement that tells if you are at a healthy weight.  Heart rate and blood pressure.  Your skin for abnormal spots. Counseling Your health care provider may ask you questions about:  Alcohol, tobacco, and drug use.  Emotional well-being.  Home and relationship well-being.  Sexual activity.  Eating habits.  History of falls.  Memory and ability to understand (cognition).  Work and work Statistician.  Pregnancy and menstrual history. What immunizations do I need?  Influenza (flu) vaccine  This is recommended every year. Tetanus, diphtheria, and pertussis (Tdap) vaccine  You may need a Td booster every 10 years. Varicella (chickenpox) vaccine  You may need this vaccine if you have not already been vaccinated. Zoster (shingles) vaccine  You may need this after age 72. Pneumococcal conjugate (PCV13) vaccine  One dose is recommended after age 72. Pneumococcal polysaccharide (PPSV23) vaccine  One dose is recommended after age 72. Measles, mumps, and rubella (MMR) vaccine  You may need at least one dose of MMR if you were born in 1957 or later. You may also need a second dose. Meningococcal conjugate (MenACWY) vaccine  You may need this if you have certain conditions. Hepatitis A vaccine  You may need this if you have certain conditions or if you travel or work in places where you may be exposed  to hepatitis A. Hepatitis B vaccine  You may need this if you have certain conditions or if you travel or work in places where you may be exposed to hepatitis B. Haemophilus influenzae type b (Hib) vaccine  You may need this if you have certain conditions. You may receive vaccines as individual doses or as more than one vaccine together in one shot (combination vaccines). Talk with your health care provider about the risks and benefits of combination vaccines. What tests do I need? Blood tests  Lipid and cholesterol levels. These may be checked every 5 years, or more frequently depending on your overall health.  Hepatitis C test.  Hepatitis B test. Screening  Lung cancer screening. You may have this screening every year starting at age 72 if you have a 30-pack-year history of smoking and currently smoke or have quit within the past 15 years.  Colorectal cancer screening. All adults should have this screening starting at age 72 and continuing until age 15. Your health care provider may recommend screening at age 72 if you are at increased risk. You will have tests every 1-10 years, depending on your results and the type of screening test.  Diabetes screening. This is done by checking your blood sugar (glucose) after you have not eaten for a while (fasting). You may have this done every 1-3 years.  Mammogram. This may be done every 1-2 years. Talk with your health care provider about how often you should have regular mammograms.  BRCA-related cancer screening. This may be done if you have a family history of breast, ovarian, tubal, or peritoneal cancers.  Other tests  Sexually transmitted disease (STD) testing.  Bone density scan. This is done to screen for osteoporosis. You may have this done starting at age 72. Follow these instructions at home: Eating and drinking  Eat a diet that includes fresh fruits and vegetables, whole grains, lean protein, and low-fat dairy products. Limit  your intake of foods with high amounts of sugar, saturated fats, and salt.  Take vitamin and mineral supplements as recommended by your health care provider.  Do not drink alcohol if your health care provider tells you not to drink.  If you drink alcohol: ? Limit how much you have to 0-1 drink a day. ? Be aware of how much alcohol is in your drink. In the [place] one drink equals one 12 oz bottle of beer (355 mL), one 5 oz glass of wine (148 mL), or one 1 oz glass of hard liquor (44 mL). Lifestyle  Take daily care of your teeth and gums.  Stay active. Exercise for at least 30 minutes on 5 or more days each week.  Do not use any products that contain nicotine or tobacco, such as cigarettes, e-cigarettes, and chewing tobacco. If you need help quitting, ask your health care provider.  If you are sexually active, practice safe sex. Use a condom or other form of protection in order to prevent STIs (sexually transmitted infections).  Talk with your health care provider about taking a low-dose aspirin or statin. What's next?  Go to your health care provider once a year for a well check visit.  Ask your health care provider how often you should have your eyes and teeth checked.  Stay up to date on all vaccines. This information is not intended to replace advice given to you by your health care provider. Make sure you discuss any questions you have with your health care provider. Document Released: 03/04/2015 Document Revised: 01/30/2018 Document Reviewed: 01/30/2018 Elsevier Patient Education  2020 [employer].

## 2018-12-01 ENCOUNTER — Other Ambulatory Visit: Payer: Self-pay | Admitting: Family Medicine

## 2018-12-01 ENCOUNTER — Telehealth: Payer: Self-pay | Admitting: *Deleted

## 2018-12-01 ENCOUNTER — Other Ambulatory Visit: Payer: Self-pay | Admitting: *Deleted

## 2018-12-01 DIAGNOSIS — Z1231 Encounter for screening mammogram for malignant neoplasm of breast: Secondary | ICD-10-CM

## 2018-12-01 DIAGNOSIS — N644 Mastodynia: Secondary | ICD-10-CM

## 2018-12-01 MED ORDER — MONTELUKAST SODIUM 10 MG PO TABS: 10.0000 mg | ORAL_TABLET | Freq: Every day | ORAL | 1 refills | Status: DC

## 2018-12-01 NOTE — Telephone Encounter (Signed)
Patient was advised and order was placed.

## 2018-12-01 NOTE — Telephone Encounter (Signed)
Ok to place orders for diagnostic mammogram bilaterally and L breast US incl axilla for diagnosis mastalgia nad screening for breast cancer (Location is GI-breast center)

## 2018-12-01 NOTE — Telephone Encounter (Signed)
Patient called back to see if mammogram she requested has been ordered yet. Also patient is requesting rx for montelukast 10 mg be resent to [facility] with 6 months of refills. Patient states she called several local pharmacy's and [facility] was the cheapest.

## 2018-12-01 NOTE — Telephone Encounter (Signed)
Patient called office stating she forgot to mention at her appt on 11/28/2018 that she is having a tingling sensation in her left breast. Patient is very concerned and would like to have mammogram now. Patient is not due for mammogram until December. Patient goes to [facility]. Please advise?

## 2018-12-01 NOTE — Telephone Encounter (Signed)
I believe that mammogram has been ordered as well

## 2018-12-02 ENCOUNTER — Other Ambulatory Visit: Payer: Self-pay

## 2018-12-02 ENCOUNTER — Ambulatory Visit (INDEPENDENT_AMBULATORY_CARE_PROVIDER_SITE_OTHER): Payer: Medicare Other

## 2018-12-02 DIAGNOSIS — Z23 Encounter for immunization: Secondary | ICD-10-CM | POA: Diagnosis not present

## 2018-12-04 ENCOUNTER — Encounter: Payer: Self-pay | Admitting: *Deleted

## 2018-12-05 ENCOUNTER — Ambulatory Visit
Admission: RE | Admit: 2018-12-05 | Discharge: 2018-12-05 | Disposition: A | Discharge status: Home / Self Care | Payer: Medicare Other | Source: Ambulatory Visit | Attending: Family Medicine | Admitting: Family Medicine

## 2018-12-05 ENCOUNTER — Other Ambulatory Visit: Payer: Self-pay

## 2018-12-05 ENCOUNTER — Ambulatory Visit: Payer: Medicare Other

## 2018-12-05 DIAGNOSIS — N644 Mastodynia: Secondary | ICD-10-CM

## 2018-12-08 ENCOUNTER — Telehealth: Payer: Self-pay

## 2018-12-08 NOTE — Telephone Encounter (Signed)
Pt called back and I could not get Mickel Baas.  I told her that the mammogram result was normal.  Con Memos

## 2018-12-08 NOTE — Telephone Encounter (Signed)
-----   Message from Virginia Crews, MD sent at 12/08/2018  8:46 AM EDT ----- Normal mammogram.  Screening mammogram recommended in 01/2019

## 2018-12-08 NOTE — Telephone Encounter (Signed)
Tried calling; pt's voicemail is full.   Thanks,   -Mickel Baas

## 2019-01-21 ENCOUNTER — Other Ambulatory Visit: Payer: Self-pay | Admitting: Family Medicine

## 2019-01-21 MED ORDER — BREO ELLIPTA 100-25 MCG/INH IN AEPB: 1.0000 | INHALATION_SPRAY | Freq: Every day | RESPIRATORY_TRACT | 0 refills | Status: DC

## 2019-01-21 NOTE — Telephone Encounter (Signed)
montelukast (SINGULAIR) 10 MG tablet   fluticasone furoate-vilanterol (BREO ELLIPTA) 100-25 MCG/INH AEPB    Patient requesting refill. Patient requesting 90 day supply.    Pharmacy:  [place], [place] - [address] [phone] (Phone) [phone] (Fax)

## 2019-01-21 NOTE — Telephone Encounter (Signed)
Requested medication (s) are due for refill today: yes  Requested medication (s) are on the active medication list: yes  Last refill:  11/28/2018  Future visit scheduled: no  Notes to clinic:  Last filled by historical provider  Review for refill   Requested Prescriptions  Pending Prescriptions Disp Refills   fluticasone furoate-vilanterol (BREO ELLIPTA) 100-25 MCG/INH AEPB       Sig: Inhale 1 puff into the lungs daily.     Pulmonology:  Combination Products Passed - 01/21/2019  1:16 PM      Passed - Valid encounter within last 12 months    Recent Outpatient Visits          1 month ago Gastroesophageal reflux disease without esophagitis   [facility], Dionne Bucy, MD

## 2019-01-22 ENCOUNTER — Other Ambulatory Visit: Payer: Self-pay | Admitting: Family Medicine

## 2019-01-22 MED ORDER — MONTELUKAST SODIUM 10 MG PO TABS: 10.0000 mg | ORAL_TABLET | Freq: Every day | ORAL | 1 refills | Status: DC

## 2019-01-22 MED ORDER — BREO ELLIPTA 100-25 MCG/INH IN AEPB: 1.0000 | INHALATION_SPRAY | Freq: Every day | RESPIRATORY_TRACT | 0 refills | Status: DC

## 2019-01-22 NOTE — Telephone Encounter (Signed)
Medication Refill - Medication:   fluticasone furoate-vilanterol (BREO ELLIPTA) 100-25 MCG/INH AEPB    montelukast (SINGULAIR) 10 MG tablet    Pt would like these rx to go to [facility] listed below.    Preferred Pharmacy:  [place], [place] - [address] [phone] (Phone) [phone] (Fax)    Pt was advised that RX refills may take up to 3 business days. We ask that you follow-up with your pharmacy.

## 2019-02-24 ENCOUNTER — Other Ambulatory Visit: Payer: Self-pay | Admitting: Obstetrics & Gynecology

## 2019-02-24 ENCOUNTER — Telehealth: Payer: Self-pay | Admitting: *Deleted

## 2019-02-24 DIAGNOSIS — Z1231 Encounter for screening mammogram for malignant neoplasm of breast: Secondary | ICD-10-CM

## 2019-02-24 DIAGNOSIS — M8580 Other specified disorders of bone density and structure, unspecified site: Secondary | ICD-10-CM

## 2019-02-24 NOTE — Telephone Encounter (Signed)
[name] called requesting order a new bone density order placed, old order will expire soon. New order placed.

## 2019-03-02 ENCOUNTER — Telehealth (INDEPENDENT_AMBULATORY_CARE_PROVIDER_SITE_OTHER): Payer: Medicare PPO | Admitting: Physician Assistant

## 2019-03-02 ENCOUNTER — Telehealth: Payer: Self-pay

## 2019-03-02 DIAGNOSIS — J4530 Mild persistent asthma, uncomplicated: Secondary | ICD-10-CM

## 2019-03-02 DIAGNOSIS — J040 Acute laryngitis: Secondary | ICD-10-CM | POA: Diagnosis not present

## 2019-03-02 DIAGNOSIS — R059 Cough, unspecified: Secondary | ICD-10-CM

## 2019-03-02 DIAGNOSIS — R05 Cough: Secondary | ICD-10-CM

## 2019-03-02 MED ORDER — PREDNISONE 10 MG PO TABS: ORAL_TABLET | ORAL | 0 refills | Status: DC

## 2019-03-02 MED ORDER — BREO ELLIPTA 100-25 MCG/INH IN AEPB: 1.0000 | INHALATION_SPRAY | Freq: Every day | RESPIRATORY_TRACT | 0 refills | Status: DC

## 2019-03-02 NOTE — Telephone Encounter (Signed)
Copied from [place] [id number]. Topic: General - Other >> Mar 02, 2019  8:48 AM Leward Quan A wrote: Reason for CRM: Patient called to inform her doctor that she had a terrible sore throat on 03/01/19 but this morning she is feeling somewhat better. States that she have had low grade fever, tiredness but not all at the same time asking for a call back from PCP with some directions please. Ph# [phone]

## 2019-03-02 NOTE — Telephone Encounter (Signed)
Virtual Visit with Adriana at 4:20 today.    Thanks,   -Mickel Baas

## 2019-03-02 NOTE — Progress Notes (Signed)
Patient: [name] Female    DOB: 1945/11/09   74 y.o.   MRN: [id number] Visit Date: 03/02/2019  Today's Provider: Trinna Post, PA-C   Chief Complaint  Patient presents with  . URI   Subjective:    I, Porsha McClurkin,CMA am acting as a Education administrator for [employer].  Virtual Visit via Video Note  I connected with [name] on 03/02/19 at  4:20 PM EST by a video enabled telemedicine application and verified that I am speaking with the correct person using two identifiers.  Location: Patient: Home Provider: Office   I discussed the limitations of evaluation and management by telemedicine and the availability of in person appointments. The patient expressed understanding and agreed to proceed.  URI  This is a new problem. The current episode started 1 to 4 weeks ago. The problem has been gradually improving. There has been no fever. Associated symptoms include congestion, headaches, rhinorrhea, sneezing, a sore throat and wheezing. Pertinent negatives include no coughing, nausea, sinus pain or vomiting. She has tried acetaminophen, NSAIDs and antihistamine for the symptoms. The treatment provided mild relief.   Reported she had symptoms on 02/22/2018 with sore throat and scratchy voice. She did have chills and slight asthma attack yesterday. She has a prescription for breo which she does not take regularly because she was concerned about the possibility of osteoporosis. She does have an albuterol inhaler which she has not been needing to use.   Allergies  Allergen Reactions  . Latex Hives  . Naproxen     Ulcers per pt  . Peanut Oil     Other reaction(s): Other (See Comments) Erythema and hot sensation  . Sulfonamide Derivatives Hives     Current Outpatient Medications:  .  albuterol (PROAIR HFA) 108 (90 Base) MCG/ACT inhaler, INHALE 2 PUFFS INTO THE LUNGS EVERY 6 (SIX) HOURS AS NEEDED., Disp: 8.5 each, Rfl: 0 .  aspirin EC 81 MG tablet, Take 81 mg by mouth  once a week., Disp: , Rfl:  .  atorvastatin (LIPITOR) 20 MG tablet, Take 10 mg by mouth every other day. , Disp: , Rfl:  .  Azelastine HCl 0.15 % SOLN, USE 2 SPRAYS IN EACH NOSTRIL ONCE DAILY, Disp: 30 mL, Rfl: 5 .  calcium citrate-vitamin D 200-200 MG-UNIT TABS, Take 2 tablets by mouth daily.  , Disp: , Rfl:  .  cetirizine (ZYRTEC) 10 MG tablet, Take 10 mg by mouth daily. Alternates with allegra, zyrtec, Disp: , Rfl:  .  chlorpheniramine (CHLOR-TRIMETON) 4 MG tablet, Take 2 tablets (8 mg total) by mouth at bedtime as needed., Disp: 14 tablet, Rfl:  .  famotidine (PEPCID) 20 MG tablet, Take 20 mg by mouth daily., Disp: , Rfl:  .  fish oil-omega-3 fatty acids 1000 MG capsule, Take 1 g by mouth every other day.  , Disp: , Rfl:  .  fluticasone furoate-vilanterol (BREO ELLIPTA) 100-25 MCG/INH AEPB, Inhale 1 puff into the lungs daily., Disp: 1 each, Rfl: 0 .  montelukast (SINGULAIR) 10 MG tablet, Take 1 tablet (10 mg total) by mouth at bedtime., Disp: 90 tablet, Rfl: 1 .  Multiple Vitamin (MULTIVITAMIN) tablet, Take 1 tablet by mouth daily.  , Disp: , Rfl:  .  naftifine (NAFTIN) 1 % cream, Apply topically daily., Disp: , Rfl:  .  triamcinolone cream (KENALOG) 0.1 %, Apply 1 application topically 2 (two) times daily., Disp: , Rfl:   Review of Systems  Constitutional: Positive for  chills and fatigue. Negative for appetite change and fever.  HENT: Positive for congestion, postnasal drip, rhinorrhea, sneezing, sore throat and voice change. Negative for sinus pressure and sinus pain.   Respiratory: Positive for chest tightness and wheezing. Negative for cough and shortness of breath.   Gastrointestinal: Negative for nausea and vomiting.  Neurological: Positive for headaches.    Social History   Tobacco Use  . Smoking status: Passive Smoke Exposure - Never Smoker  . Smokeless tobacco: Never Used  Substance Use Topics  . Alcohol use: Yes    Alcohol/week: 0.0 standard drinks    Comment: rare        Objective:   There were no vitals taken for this visit. There were no vitals filed for this visit.There is no height or weight on file to calculate BMI.   Physical Exam Constitutional:      General: She is not in acute distress.    Appearance: Normal appearance. She is not ill-appearing or toxic-appearing.     Comments: Voice is hoarse.   Pulmonary:     Effort: Pulmonary effort is normal. No respiratory distress.  Neurological:     Mental Status: She is alert.  Psychiatric:        Mood and Affect: Mood normal.        Behavior: Behavior normal.      No results found for any visits on 03/02/19.     Assessment & Plan    1. Cough  Treat for laryngitis/bronchitis as below. Advised about testing at [facility] and ability to schedule there. She does have an appointment at [facility] on 03/03/2018 but may get tested at [facility] if availability allows.  - predniSONE (DELTASONE) 10 MG tablet; Take 6 pills on day 1, take 5 pills on day 2 and so on until complete.  Dispense: 21 tablet; Refill: 0  2. Laryngitis  - predniSONE (DELTASONE) 10 MG tablet; Take 6 pills on day 1, take 5 pills on day 2 and so on until complete.  Dispense: 21 tablet; Refill: 0  3. Mild persistent asthma with allergic rhinitis without complication  Discussed the risks and benefits of Breo. Discussed that while there is a small risk of bone loss and fracture, this risk is likely more pronounced by taking multiple bursts and tapers of oral prednisone that likely would have been prevented with breo. She is agreeable to starting Breo and I have sent this in for her.   - fluticasone furoate-vilanterol (BREO ELLIPTA) 100-25 MCG/INH AEPB; Inhale 1 puff into the lungs daily.  Dispense: 1 each; Refill: 0  I discussed the assessment and treatment plan with the patient. The patient was provided an opportunity to ask questions and all were answered. The patient agreed with the plan and demonstrated an understanding of the  instructions.   The patient was advised to call back or seek an in-person evaluation if the symptoms worsen or if the condition fails to improve as anticipated.  I provided 25 minutes of non-face-to-face time during this encounter.  The entirety of the information documented in the History of Present Illness, Review of Systems and Physical Exam were personally obtained by me. Portions of this information were initially documented by [facility] and reviewed by me for thoroughness and accuracy.      Trinna Post, PA-C  [facility]

## 2019-03-04 ENCOUNTER — Ambulatory Visit: Payer: Medicare PPO | Attending: Internal Medicine

## 2019-03-04 DIAGNOSIS — Z20822 Contact with and (suspected) exposure to covid-19: Secondary | ICD-10-CM

## 2019-03-05 LAB — NOVEL CORONAVIRUS, NAA: SARS-CoV-2, NAA: NOT DETECTED

## 2019-04-02 ENCOUNTER — Ambulatory Visit: Payer: Medicare PPO | Attending: Internal Medicine

## 2019-04-02 ENCOUNTER — Other Ambulatory Visit: Payer: Self-pay

## 2019-04-02 DIAGNOSIS — Z23 Encounter for immunization: Secondary | ICD-10-CM | POA: Insufficient documentation

## 2019-04-02 NOTE — Progress Notes (Signed)
   Covid-19 Vaccination Clinic  Name:  LIVYA SHADY    MRN: [id number] DOB: 09-Feb-1946  04/02/2019  Ms. Dement was observed post Covid-19 immunization for 15 minutes without incidence. She was provided with Vaccine Information Sheet and instruction to access the V-Safe system.   Ms. Lala was instructed to call 911 with any severe reactions post vaccine: Marland Kitchen Difficulty breathing  . Swelling of your face and throat  . A fast heartbeat  . A bad rash all over your body  . Dizziness and weakness    Immunizations Administered    Name Date Dose VIS Date Route   Pfizer COVID-19 Vaccine 04/02/2019 11:02 AM 0.3 mL 01/30/2019 Intramuscular   Manufacturer: [place]   Lot: [id number]   [place]: [id number]

## 2019-04-25 ENCOUNTER — Encounter: Payer: Self-pay | Admitting: Family Medicine

## 2019-04-29 ENCOUNTER — Ambulatory Visit: Payer: Medicare PPO | Attending: Internal Medicine

## 2019-04-29 DIAGNOSIS — Z23 Encounter for immunization: Secondary | ICD-10-CM | POA: Insufficient documentation

## 2019-04-29 NOTE — Progress Notes (Signed)
   Covid-19 Vaccination Clinic  Name:  [name]    MRN: [id number] DOB: 13-Jun-1945  04/29/2019  [name] was observed post Covid-19 immunization for 15 minutes without incident. She was provided with Vaccine Information Sheet and instruction to access the V-Safe system.   [name] was instructed to call 911 with any severe reactions post vaccine: Marland Kitchen Difficulty breathing  . Swelling of face and throat  . A fast heartbeat  . A bad rash all over body  . Dizziness and weakness   Immunizations Administered    Name Date Dose VIS Date Route   Pfizer COVID-19 Vaccine 04/29/2019 11:02 AM 0.3 mL 01/30/2019 Intramuscular   Manufacturer: [place]   Lot: [id number]   [place]: [id number]

## 2019-05-05 ENCOUNTER — Telehealth: Payer: Self-pay

## 2019-05-05 DIAGNOSIS — Z1211 Encounter for screening for malignant neoplasm of colon: Secondary | ICD-10-CM

## 2019-05-05 NOTE — Telephone Encounter (Signed)
Copied from [place] [id number]. Topic: Referral - Request for Referral >> May 05, 2019 11:14 AM Celene Kras wrote: Has patient seen PCP for this complaint? No. *If NO, is insurance requiring patient see PCP for this issue before PCP can refer them? Referral for which specialty: Gastroenterology  Preferred provider/office: Dr. Coy Saunas gastroenterology  Reason for referral: Pt states colon cancer runs in her family  and possible blood in stool. Please advise.   Fax#: [phone]  Phone #: [phone]

## 2019-05-05 NOTE — Telephone Encounter (Signed)
Please advise 

## 2019-05-06 NOTE — Addendum Note (Signed)
Addended by: Shawna Orleans on: 05/06/2019 02:02 PM   Modules accepted: Orders

## 2019-05-06 NOTE — Telephone Encounter (Signed)
Ok to place referral.

## 2019-05-08 NOTE — Telephone Encounter (Signed)
Pt calling to check on this.  States that she called [facility] this morning and they still don't have the referral.  Pt would like this changed from Dr. Dema Severin to dr. Cephas Darby at same office.

## 2019-05-11 NOTE — Telephone Encounter (Signed)
Patient advised that referral has been sent and GI office will call her to schedule. Gave patient phone number to call and to ask schedule with Dr. Cephas Darby.

## 2019-05-12 ENCOUNTER — Telehealth: Payer: Self-pay

## 2019-05-12 NOTE — Telephone Encounter (Signed)
Copied from [place] [phone]. Topic: Referral - Status >> May 12, 2019 12:33 PM Erick Blinks wrote: Reason for CRM: Pt called in regards to the three page reference needed for her referral for GI at [place] for bloody stools   [facility]: [phone]  Fax: [phone]

## 2019-05-13 NOTE — Telephone Encounter (Signed)
Spoke to [employer] yesterday, she called [facility] and they will send over the forms to be filled out.

## 2019-06-02 ENCOUNTER — Telehealth: Payer: Self-pay

## 2019-06-02 NOTE — Telephone Encounter (Signed)
Copied from [place] [phone]. Topic: General - Other >> Jun 02, 2019 10:54 AM Celene Kras wrote: Reason for CRM: Pt called and is concerned because her referral for her colonoscopy was put in as normal. Pt states that she is having the colonoscopy because she found blood in her stool and because her father had colon cancer and is requesting to know if this should be put in as an urgent colonoscopy.

## 2019-06-02 NOTE — Telephone Encounter (Signed)
Pt advised.   Thanks,   -[name]  

## 2019-06-02 NOTE — Telephone Encounter (Signed)
The referral has already been processed, so changing the urgency/speed of it will not change when she has her colonoscopy.  The paperwork has been signed and completed, so it is up to [name] when she has the colonoscopy procedure

## 2019-06-04 LAB — HM COLONOSCOPY

## 2019-06-08 ENCOUNTER — Encounter: Payer: Self-pay | Admitting: Family Medicine

## 2019-06-24 ENCOUNTER — Ambulatory Visit: Payer: Medicare Other | Admitting: Podiatry

## 2019-07-03 ENCOUNTER — Encounter: Payer: Medicare Other | Admitting: Family Medicine

## 2019-08-21 ENCOUNTER — Telehealth: Payer: Self-pay

## 2019-08-21 NOTE — Telephone Encounter (Signed)
Copied from [place] [phone]. Topic: General - Other >> Aug 21, 2019  8:14 AM Celene Kras wrote: Reason for CRM: Pt called and is requesting to have a full panel of blood work. Please advise.

## 2019-08-25 ENCOUNTER — Ambulatory Visit: Payer: Medicare PPO | Admitting: Physician Assistant

## 2019-08-25 NOTE — Telephone Encounter (Signed)
Pt needed an appointment. I scheduled her for 08/27/2019 with Adriana.   Thanks,   -Mickel Baas

## 2019-08-27 ENCOUNTER — Other Ambulatory Visit: Payer: Self-pay

## 2019-08-27 ENCOUNTER — Encounter: Payer: Self-pay | Admitting: Physician Assistant

## 2019-08-27 ENCOUNTER — Ambulatory Visit (INDEPENDENT_AMBULATORY_CARE_PROVIDER_SITE_OTHER): Payer: Medicare PPO | Admitting: Physician Assistant

## 2019-08-27 VITALS — BP 122/68 | HR 77 | Temp 96.8°F | Ht 63.0 in | Wt 169.8 lb

## 2019-08-27 DIAGNOSIS — G5603 Carpal tunnel syndrome, bilateral upper limbs: Secondary | ICD-10-CM | POA: Diagnosis not present

## 2019-08-27 DIAGNOSIS — Z23 Encounter for immunization: Secondary | ICD-10-CM

## 2019-08-27 DIAGNOSIS — M79674 Pain in right toe(s): Secondary | ICD-10-CM | POA: Diagnosis not present

## 2019-08-27 DIAGNOSIS — E782 Mixed hyperlipidemia: Secondary | ICD-10-CM | POA: Diagnosis not present

## 2019-08-27 DIAGNOSIS — R7303 Prediabetes: Secondary | ICD-10-CM | POA: Diagnosis not present

## 2019-08-27 DIAGNOSIS — Z0000 Encounter for general adult medical examination without abnormal findings: Secondary | ICD-10-CM | POA: Diagnosis not present

## 2019-08-27 NOTE — Patient Instructions (Signed)
Preventive Care 73 Years and Older, Female Preventive care refers to lifestyle choices and visits with your health care provider that can promote health and wellness. This includes:  A yearly physical exam. This is also called an annual well check.  Regular dental and eye exams.  Immunizations.  Screening for certain conditions.  Healthy lifestyle choices, such as diet and exercise. What can I expect for my preventive care visit? Physical exam Your health care provider will check:  Height and weight. These may be used to calculate body mass index (BMI), which is a measurement that tells if you are at a healthy weight.  Heart rate and blood pressure.  Your skin for abnormal spots. Counseling Your health care provider may ask you questions about:  Alcohol, tobacco, and drug use.  Emotional well-being.  Home and relationship well-being.  Sexual activity.  Eating habits.  History of falls.  Memory and ability to understand (cognition).  Work and work Statistician.  Pregnancy and menstrual history. What immunizations do I need?  Influenza (flu) vaccine  This is recommended every year. Tetanus, diphtheria, and pertussis (Tdap) vaccine  You may need a Td booster every 10 years. Varicella (chickenpox) vaccine  You may need this vaccine if you have not already been vaccinated. Zoster (shingles) vaccine  You may need this after age 73. Pneumococcal conjugate (PCV13) vaccine  One dose is recommended after age 73. Pneumococcal polysaccharide (PPSV23) vaccine  One dose is recommended after age 72. Measles, mumps, and rubella (MMR) vaccine  You may need at least one dose of MMR if you were born in 1957 or later. You may also need a second dose. Meningococcal conjugate (MenACWY) vaccine  You may need this if you have certain conditions. Hepatitis A vaccine  You may need this if you have certain conditions or if you travel or work in places where you may be exposed  to hepatitis A. Hepatitis B vaccine  You may need this if you have certain conditions or if you travel or work in places where you may be exposed to hepatitis B. Haemophilus influenzae type b (Hib) vaccine  You may need this if you have certain conditions. You may receive vaccines as individual doses or as more than one vaccine together in one shot (combination vaccines). Talk with your health care provider about the risks and benefits of combination vaccines. What tests do I need? Blood tests  Lipid and cholesterol levels. These may be checked every 5 years, or more frequently depending on your overall health.  Hepatitis C test.  Hepatitis B test. Screening  Lung cancer screening. You may have this screening every year starting at age 73 if you have a 30-pack-year history of smoking and currently smoke or have quit within the past 15 years.  Colorectal cancer screening. All adults should have this screening starting at age 73 and continuing until age 15. Your health care provider may recommend screening at age 23 if you are at increased risk. You will have tests every 1-10 years, depending on your results and the type of screening test.  Diabetes screening. This is done by checking your blood sugar (glucose) after you have not eaten for a while (fasting). You may have this done every 1-3 years.  Mammogram. This may be done every 1-2 years. Talk with your health care provider about how often you should have regular mammograms.  BRCA-related cancer screening. This may be done if you have a family history of breast, ovarian, tubal, or peritoneal cancers.  Other tests  Sexually transmitted disease (STD) testing.  Bone density scan. This is done to screen for osteoporosis. You may have this done starting at age 73. Follow these instructions at home: Eating and drinking  Eat a diet that includes fresh fruits and vegetables, whole grains, lean protein, and low-fat dairy products. Limit  your intake of foods with high amounts of sugar, saturated fats, and salt.  Take vitamin and mineral supplements as recommended by your health care provider.  Do not drink alcohol if your health care provider tells you not to drink.  If you drink alcohol: ? Limit how much you have to 0-1 drink a day. ? Be aware of how much alcohol is in your drink. In the [place] one drink equals one 12 oz bottle of beer (355 mL), one 5 oz glass of wine (148 mL), or one 1 oz glass of hard liquor (44 mL). Lifestyle  Take daily care of your teeth and gums.  Stay active. Exercise for at least 30 minutes on 5 or more days each week.  Do not use any products that contain nicotine or tobacco, such as cigarettes, e-cigarettes, and chewing tobacco. If you need help quitting, ask your health care provider.  If you are sexually active, practice safe sex. Use a condom or other form of protection in order to prevent STIs (sexually transmitted infections).  Talk with your health care provider about taking a low-dose aspirin or statin. What's next?  Go to your health care provider once a year for a well check visit.  Ask your health care provider how often you should have your eyes and teeth checked.  Stay up to date on all vaccines. This information is not intended to replace advice given to you by your health care provider. Make sure you discuss any questions you have with your health care provider. Document Revised: 01/30/2018 Document Reviewed: 01/30/2018 Elsevier Patient Education  2020 [employer].

## 2019-08-27 NOTE — Progress Notes (Signed)
Complete physical exam   Patient: [name]   DOB: [birth year]   74 y.o. Female  MRN: [id number] Visit Date: 08/27/2019  Today's healthcare provider: Trinna Post, PA-C   Chief Complaint  Patient presents with  . Medicare Wellness  I,[name] M Hallie Ertl,acting as a scribe for Trinna Post, PA-C.,have documented all relevant documentation on the behalf of Trinna Post, PA-C,as directed by  Trinna Post, PA-C while in the presence of Trinna Post, PA-C.  Subjective    [name] is a 74 y.o. female who presents today for a complete physical exam.  She reports consuming a general diet. The patient does not participate in regular exercise at present. She generally feels well. She reports sleeping well. She does have additional problems to discuss today.   Patient reports she had pain after having her colonoscopy she stated having left left pain that shoots down her leg. Patient denies any tingling and numbness or weakness.  Patient reports she has left hand/ wrist pain, with numbness and tingling. This gets worse after sleeping in the morning.   Patient reports that he right big toe is painful and hard. Patient states that sometimes it hurts that she can't wear shoes or walk on her feet.   Lipid/Cholesterol, Follow-up  Last lipid panel Other pertinent labs  Lab Results  Component Value Date   CHOL 264 (H) 08/27/2019   HDL 62 08/27/2019   LDLCALC 180 (H) 08/27/2019   TRIG 122 08/27/2019   CHOLHDL 4.3 08/27/2019   Lab Results  Component Value Date   ALT 14 08/27/2019   AST 19 08/27/2019   PLT 245 08/27/2019   TSH 2.170 08/27/2019     She was last seen for this 1 years ago.  Management since that visit includes lipitor 10 mg every other day.  She reports excellent compliance with treatment. She is not having side effects.   Symptoms: No chest pain No chest pressure/discomfort  No dyspnea No lower extremity edema  No numbness or tingling of  extremity No orthopnea  No palpitations No paroxysmal nocturnal dyspnea  No speech difficulty No syncope   Current diet: not asked Current exercise: housecleaning  The 10-year ASCVD risk score Mikey Bussing DC Jr., et al., 2013) is: 12.4%  ---------------------------------------------------------------------------------------------------  Prediabetes, Follow-up  Lab Results  Component Value Date   HGBA1C 5.8 (H) 08/27/2019   HGBA1C 6.0 12/24/2017   GLUCOSE 87 08/27/2019    Last seen for for this1 years ago.  Management since that visit includes none. Current symptoms include none and have been unchanged.  Prior visit with dietician: no Current diet: not asked Current exercise: housecleaning  Pertinent Labs:    Component Value Date/Time   CHOL 264 (H) 08/27/2019 1442   TRIG 122 08/27/2019 1442   CHOLHDL 4.3 08/27/2019 1442   CREATININE 0.69 08/27/2019 1442    Wt Readings from Last 3 Encounters:  08/27/19 169 lb 12.8 oz (77 kg)  02/04/18 174 lb (78.9 kg)  12/20/16 173 lb (78.5 kg)    -----------------------------------------------------------------------------------------  Past Medical History:  Diagnosis Date  . Allergic rhinitis, cause unspecified   . Anxiety disorder   . Arthritis   . Asthma without status asthmaticus   . Chronic bronchitis   . Cystoid macular edema of right eye   . Depression   . Diverticulosis   . ERM OD (epiretinal membrane, right eye)   . Esophageal reflux   . GERD (gastroesophageal reflux disease)   .  Heart murmur   . Hx of adenomatous colonic polyps   . IBS (irritable bowel syndrome)   . Nuclear sclerotic cataract of both eyes   . Osteopenia 01/2016   T score -1.7 FRAX 9.5%/1.6% stable from prior DEXA  . Other acute sinusitis   . Other and unspecified hyperlipidemia   . Pituitary adenoma ([place])   . Rectocele   . Recurrent urinary tract infection   . Retinal hole   . Toenail fungus   . Unspecified asthma(493.90)    Past Surgical  History:  Procedure Laterality Date  . BREAST BIOPSY  1976  . CATARACT EXTRACTION Bilateral   . COLONOSCOPY    . COLONOSCOPY WITH PROPOFOL N/A 01/20/2015   Procedure: COLONOSCOPY WITH PROPOFOL;  Surgeon: Manya Silvas, MD;  Location: [facility] ENDOSCOPY;  Service: Endoscopy;  Laterality: N/A;  . ESOPHAGOGASTRODUODENOSCOPY    . FLEXIBLE SIGMOIDOSCOPY    . FOOT SURGERY     Sewing needle removed  . TONSILLECTOMY  1950  . WISDOM TOOTH EXTRACTION     Social History   Socioeconomic History  . Marital status: Divorced    Spouse name: Not on file  . Number of children: 2  . Years of education: Not on file  . Highest education level: Not on file  Occupational History  . Occupation: retired Pharmacist, hospital  Tobacco Use  . Smoking status: Passive Smoke Exposure - Never Smoker  . Smokeless tobacco: Never Used  Vaping Use  . Vaping Use: Never used  Substance and Sexual Activity  . Alcohol use: Yes    Alcohol/week: 0.0 standard drinks    Comment: rare  . Drug use: No  . Sexual activity: Not Currently    Birth control/protection: Post-menopausal    Comment: 1st intercourse 74 yo-Fewer than 5 partners  Other Topics Concern  . Not on file  Social History Narrative  . Not on file   Social Determinants of Health   Financial Resource Strain:   . Difficulty of Paying Living Expenses:   Food Insecurity:   . Worried About Charity fundraiser in the Last Year:   . Arboriculturist in the Last Year:   Transportation Needs:   . Film/video editor (Medical):   Marland Kitchen Lack of Transportation (Non-Medical):   Physical Activity:   . Days of Exercise per Week:   . Minutes of Exercise per Session:   Stress:   . Feeling of Stress :   Social Connections:   . Frequency of Communication with Friends and Family:   . Frequency of Social Gatherings with Friends and Family:   . Attends Religious Services:   . Active Member of Clubs or Organizations:   . Attends Archivist Meetings:   Marland Kitchen Marital  Status:   Intimate Partner Violence:   . Fear of Current or Ex-Partner:   . Emotionally Abused:   Marland Kitchen Physically Abused:   . Sexually Abused:    Family Status  Relation Name Status  . Father  (Not Specified)  . Mother  (Not Specified)  . MGM  (Not Specified)  . Brother  (Not Specified)  . Mat Uncle  (Not Specified)  . Ethlyn Daniels  (Not Specified)  . PGM  (Not Specified)  . Mat Aunt  (Not Specified)  . PGF  (Not Specified)  . Sister  (Not Specified)  . Other Nephew died 06-04-17 Deceased  . Niece  (Not Specified)   Family History  Problem Relation Age of Onset  . Colon cancer  Father   . Lung cancer Father        smoker  . Heart attack Father   . Hypertension Father   . Stomach cancer Father   . Bladder Cancer Father   . Hypertension Mother   . Osteoporosis Mother   . Stroke Mother   . Thyroid disease Mother   . Liver cancer Maternal Grandmother   . Colon polyps Brother   . Prostate cancer Maternal Uncle   . Breast cancer Paternal Aunt        Age 55's  . Breast cancer Paternal Grandmother        Age 68's  . Diabetes Paternal Grandmother   . Osteoporosis Maternal Aunt   . Brain cancer Maternal Aunt   . Lung cancer Paternal Grandfather   . Colon polyps Sister   . Thyroid disease Sister   . Colon cancer Other   . Melanoma Niece    Allergies  Allergen Reactions  . Latex Hives  . Naproxen     Ulcers per pt  . Peanut Oil     Other reaction(s): Other (See Comments) Erythema and hot sensation  . Sulfonamide Derivatives Hives    Patient Care Team: Virginia Crews, MD as PCP - General (Family Medicine) Elsie Stain, MD (Pulmonary Disease)   Medications: Outpatient Medications Prior to Visit  Medication Sig  . albuterol (PROAIR HFA) 108 (90 Base) MCG/ACT inhaler INHALE 2 PUFFS INTO THE LUNGS EVERY 6 (SIX) HOURS AS NEEDED.  Marland Kitchen aspirin EC 81 MG tablet Take 81 mg by mouth once a week.  Marland Kitchen atorvastatin (LIPITOR) 20 MG tablet Take 10 mg by mouth every other day.     . Azelastine HCl 0.15 % SOLN USE 2 SPRAYS IN EACH NOSTRIL ONCE DAILY  . calcium citrate-vitamin D 200-200 MG-UNIT TABS Take 2 tablets by mouth daily.    . cetirizine (ZYRTEC) 10 MG tablet Take 10 mg by mouth daily. Alternates with allegra, zyrtec  . chlorpheniramine (CHLOR-TRIMETON) 4 MG tablet Take 2 tablets (8 mg total) by mouth at bedtime as needed.  . fluticasone furoate-vilanterol (BREO ELLIPTA) 100-25 MCG/INH AEPB Inhale 1 puff into the lungs daily.  . montelukast (SINGULAIR) 10 MG tablet Take 1 tablet (10 mg total) by mouth at bedtime.  . Multiple Vitamin (MULTIVITAMIN) tablet Take 1 tablet by mouth daily.    . naftifine (NAFTIN) 1 % cream Apply topically daily.  Marland Kitchen triamcinolone cream (KENALOG) 0.1 % Apply 1 application topically 2 (two) times daily.  . [DISCONTINUED] famotidine (PEPCID) 20 MG tablet Take 20 mg by mouth daily. (Patient not taking: Reported on 08/27/2019)  . [DISCONTINUED] fish oil-omega-3 fatty acids 1000 MG capsule Take 1 g by mouth every other day.   (Patient not taking: Reported on 08/27/2019)  . [DISCONTINUED] predniSONE (DELTASONE) 10 MG tablet Take 6 pills on day 1, take 5 pills on day 2 and so on until complete. (Patient not taking: Reported on 08/27/2019)   No facility-administered medications prior to visit.    Review of Systems  Constitutional: Negative.   HENT: Negative.   Eyes: Negative.   Respiratory: Negative.   Cardiovascular: Negative.   Gastrointestinal: Negative.   Endocrine: Negative.   Genitourinary: Positive for flank pain.  Skin: Negative.   Allergic/Immunologic: Negative.   Neurological: Negative.   Hematological: Negative.   Psychiatric/Behavioral: Negative.       Objective    BP 122/68 (BP Location: Left Arm, Patient Position: Sitting, Cuff Size: Normal)   Pulse 77   Temp Marland Kitchen)  96.8 F (36 C) (Temporal)   Ht 5\' 3"  (1.6 m)   Wt 169 lb 12.8 oz (77 kg)   SpO2 97%   BMI 30.08 kg/m    Physical Exam Constitutional:      Appearance:  Normal appearance.  HENT:     Right Ear: Tympanic membrane, ear canal and external ear normal.     Left Ear: Tympanic membrane, ear canal and external ear normal.  Cardiovascular:     Rate and Rhythm: Normal rate and regular rhythm.     Pulses: Normal pulses.     Heart sounds: Normal heart sounds.  Pulmonary:     Effort: Pulmonary effort is normal.     Breath sounds: Normal breath sounds.  Abdominal:     General: Abdomen is flat. Bowel sounds are normal.     Palpations: Abdomen is soft.  Musculoskeletal:     Comments: Phalen's test positive.   Skin:    General: Skin is warm and dry.  Neurological:     General: No focal deficit present.     Mental Status: She is alert and oriented to person, place, and time.  Psychiatric:        Mood and Affect: Mood normal.        Behavior: Behavior normal.       Last depression screening scores PHQ 2/9 Scores 08/27/2019 11/28/2018  PHQ - 2 Score 0 0  PHQ- 9 Score 0 4   Last fall risk screening Fall Risk  08/27/2019  Falls in the past year? 0  Number falls in past yr: 0  Injury with Fall? 0   Last Audit-C alcohol use screening Alcohol Use Disorder Test (AUDIT) 08/27/2019  1. How often do you have a drink containing alcohol? 2  2. How many drinks containing alcohol do you have on a typical day when you are drinking? 0  3. How often do you have six or more drinks on one occasion? 0  AUDIT-C Score 2   A score of 3 or more in women, and 4 or more in men indicates increased risk for alcohol abuse, EXCEPT if all of the points are from question 1   Results for orders placed or performed in visit on 08/27/19  TSH  Result Value Ref Range   TSH 2.170 0.450 - 4.500 uIU/mL  Lipid panel  Result Value Ref Range   Cholesterol, Total 264 (H) 100 - 199 mg/dL   Triglycerides 122 0 - 149 mg/dL   HDL 62 >39 mg/dL   VLDL Cholesterol Cal 22 5 - 40 mg/dL   LDL Chol Calc (NIH) 180 (H) 0 - 99 mg/dL   Chol/HDL Ratio 4.3 0.0 - 4.4 ratio  Comprehensive  metabolic panel  Result Value Ref Range   Glucose 87 65 - 99 mg/dL   BUN 13 8 - 27 mg/dL   Creatinine, Ser 0.69 0.57 - 1.00 mg/dL   GFR calc non Af Amer 87 >59 mL/min/1.73   GFR calc Af Amer 100 >59 mL/min/1.73   BUN/Creatinine Ratio 19 12 - 28   Sodium 140 134 - 144 mmol/L   Potassium 4.1 3.5 - 5.2 mmol/L   Chloride 104 96 - 106 mmol/L   CO2 24 20 - 29 mmol/L   Calcium 9.4 8.7 - 10.3 mg/dL   Total Protein 6.7 6.0 - 8.5 g/dL   Albumin 4.2 3.7 - 4.7 g/dL   Globulin, Total 2.5 1.5 - 4.5 g/dL   Albumin/Globulin Ratio 1.7 1.2 - 2.2  Bilirubin Total 0.3 0.0 - 1.2 mg/dL   Alkaline Phosphatase 88 48 - 121 IU/L   AST 19 0 - 40 IU/L   ALT 14 0 - 32 IU/L  CBC with Differential/Platelet  Result Value Ref Range   WBC 6.3 3.4 - 10.8 x10E3/uL   RBC 4.15 3.77 - 5.28 x10E6/uL   Hemoglobin 12.9 11.1 - 15.9 g/dL   Hematocrit 38.9 34.0 - 46.6 %   MCV 94 79 - 97 fL   MCH 31.1 26.6 - 33.0 pg   MCHC 33.2 31 - 35 g/dL   RDW 13.1 11.7 - 15.4 %   Platelets 245 150 - 450 x10E3/uL   Neutrophils 62 Not Estab. %   Lymphs 26 Not Estab. %   Monocytes 7 Not Estab. %   Eos 4 Not Estab. %   Basos 1 Not Estab. %   Neutrophils Absolute 3.9 1 - 7 x10E3/uL   Lymphocytes Absolute 1.6 0 - 3 x10E3/uL   Monocytes Absolute 0.4 0 - 0 x10E3/uL   EOS (ABSOLUTE) 0.2 0.0 - 0.4 x10E3/uL   Basophils Absolute 0.1 0 - 0 x10E3/uL   Immature Granulocytes 0 Not Estab. %   Immature Grans (Abs) 0.0 0.0 - 0.1 x10E3/uL  Hemoglobin A1c  Result Value Ref Range   Hgb A1c MFr Bld 5.8 (H) 4.8 - 5.6 %   Est. average glucose Bld gHb Est-mCnc 120 mg/dL  HM COLONOSCOPY  Result Value Ref Range   HM Colonoscopy See Report (in chart) See Report (in chart), Patient Reported    Assessment & Plan    Routine Health Maintenance and Physical Exam  Exercise Activities and Dietary recommendations Goals   None     Immunization History  Administered Date(s) Administered  . Fluad Quad(high Dose 65+) 12/02/2018  . Influenza Split  10/21/2011, 11/19/2012  . Influenza Whole 11/07/2007, 11/16/2010  . Influenza,inj,Quad PF,6+ Mos 11/12/2013  . PFIZER SARS-COV-2 Vaccination 04/02/2019, 04/29/2019  . Pneumococcal Polysaccharide-23 11/29/2009, 08/27/2019  . Td 04/17/2018  . Zoster 11/19/2009  . Zoster Recombinat (Shingrix) 08/08/2016, 11/07/2016    Health Maintenance  Topic Date Due  . INFLUENZA VACCINE  09/20/2019  . MAMMOGRAM  01/24/2020  . PNA vac Low Risk Adult (2 of 2 - PCV13) 08/26/2020  . COLONOSCOPY  06/03/2024  . TETANUS/TDAP  04/17/2028  . DEXA SCAN  Completed  . COVID-19 Vaccine  Completed  . Hepatitis C Screening  Completed    Discussed health benefits of physical activity, and encouraged her to engage in regular exercise appropriate for her age and condition.  1. Annual physical exam   2. Mixed hyperlipidemia  Uncontrolled on Lipitor 10 mg every other night. Recommend either lipitor 20 mg every other night or crestor 10 mg nightly.   - TSH - Lipid panel - Comprehensive metabolic panel - CBC with Differential/Platelet   3. Pre-diabetes  Stable.   - Hemoglobin A1c  4. Need for 23-polyvalent pneumococcal polysaccharide vaccine  - Pneumococcal polysaccharide vaccine 23-valent greater than or equal to 73yo subcutaneous/IM  5. Bilateral carpal tunnel syndrome  Cock up wrist splints bilaterally at night x 6 weeks. May need ortho referral in future.   6. Pain of toe of right foot  She has an upcoming appointment with podiatry.    Return in about 6 months (around 02/27/2020) for chronic .     ITrinna Post, PA-C, have reviewed all documentation for this visit. The documentation on 09/01/19 for the exam, diagnosis, procedures, and orders are all accurate and  complete.     Paulene Floor  [facility] [phone] (phone) [phone] (fax)  [place]

## 2019-08-28 LAB — COMPREHENSIVE METABOLIC PANEL
ALT: 14 IU/L (ref 0–32)
AST: 19 IU/L (ref 0–40)
Albumin/Globulin Ratio: 1.7 (ref 1.2–2.2)
Albumin: 4.2 g/dL (ref 3.7–4.7)
Alkaline Phosphatase: 88 IU/L (ref 48–121)
BUN/Creatinine Ratio: 19 (ref 12–28)
BUN: 13 mg/dL (ref 8–27)
Bilirubin Total: 0.3 mg/dL (ref 0.0–1.2)
CO2: 24 mmol/L (ref 20–29)
Calcium: 9.4 mg/dL (ref 8.7–10.3)
Chloride: 104 mmol/L (ref 96–106)
Creatinine, Ser: 0.69 mg/dL (ref 0.57–1.00)
GFR calc Af Amer: 100 mL/min/{1.73_m2} (ref >59)
GFR calc non Af Amer: 87 mL/min/{1.73_m2} (ref >59)
Globulin, Total: 2.5 g/dL (ref 1.5–4.5)
Glucose: 87 mg/dL (ref 65–99)
Potassium: 4.1 mmol/L (ref 3.5–5.2)
Sodium: 140 mmol/L (ref 134–144)
Total Protein: 6.7 g/dL (ref 6.0–8.5)

## 2019-08-28 LAB — CBC WITH DIFFERENTIAL/PLATELET
Basophils Absolute: 0.1 10*3/uL (ref 0.0–0.2)
Basos: 1 %
EOS (ABSOLUTE): 0.2 10*3/uL (ref 0.0–0.4)
Eos: 4 %
Hematocrit: 38.9 % (ref 34.0–46.6)
Hemoglobin: 12.9 g/dL (ref 11.1–15.9)
Immature Grans (Abs): 0 10*3/uL (ref 0.0–0.1)
Immature Granulocytes: 0 %
Lymphocytes Absolute: 1.6 10*3/uL (ref 0.7–3.1)
Lymphs: 26 %
MCH: 31.1 pg (ref 26.6–33.0)
MCHC: 33.2 g/dL (ref 31.5–35.7)
MCV: 94 fL (ref 79–97)
Monocytes Absolute: 0.4 10*3/uL (ref 0.1–0.9)
Monocytes: 7 %
Neutrophils Absolute: 3.9 10*3/uL (ref 1.4–7.0)
Neutrophils: 62 %
Platelets: 245 10*3/uL (ref 150–450)
RBC: 4.15 x10E6/uL (ref 3.77–5.28)
RDW: 13.1 % (ref 11.7–15.4)
WBC: 6.3 10*3/uL (ref 3.4–10.8)

## 2019-08-28 LAB — LIPID PANEL
Chol/HDL Ratio: 4.3 ratio (ref 0.0–4.4)
Cholesterol, Total: 264 mg/dL — ABNORMAL HIGH (ref 100–199)
HDL: 62 mg/dL (ref >39)
LDL Chol Calc (NIH): 180 mg/dL — ABNORMAL HIGH (ref 0–99)
Triglycerides: 122 mg/dL (ref 0–149)
VLDL Cholesterol Cal: 22 mg/dL (ref 5–40)

## 2019-08-28 LAB — HEMOGLOBIN A1C
Est. average glucose Bld gHb Est-mCnc: 120 mg/dL
Hgb A1c MFr Bld: 5.8 % — ABNORMAL HIGH (ref 4.8–5.6)

## 2019-08-28 LAB — TSH: TSH: 2.17 u[IU]/mL (ref 0.450–4.500)

## 2019-09-01 ENCOUNTER — Encounter: Payer: Self-pay | Admitting: Physician Assistant

## 2019-09-22 ENCOUNTER — Ambulatory Visit: Payer: Medicare Other

## 2019-09-22 ENCOUNTER — Encounter: Payer: Self-pay | Admitting: Physician Assistant

## 2019-09-22 ENCOUNTER — Other Ambulatory Visit: Payer: Medicare Other

## 2019-09-23 ENCOUNTER — Encounter: Payer: Self-pay | Admitting: Family Medicine

## 2019-09-23 NOTE — Telephone Encounter (Signed)
Can we update her history? Thanks.

## 2019-09-23 NOTE — Telephone Encounter (Signed)
Patient family history is in chart.

## 2019-09-28 NOTE — Progress Notes (Signed)
Subjective:   [name] is a 74 y.o. female who presents for an Initial Medicare Annual Wellness Visit.  I connected with Candace Gallus today by telephone and verified that I am speaking with the correct person using two identifiers. Location patient: home Location provider: work Persons participating in the virtual visit: patient, provider.   I discussed the limitations, risks, security and privacy concerns of performing an evaluation and management service by telephone and the availability of in person appointments. I also discussed with the patient that there may be a patient responsible charge related to this service. The patient expressed understanding and verbally consented to this telephonic visit.    Interactive audio and video telecommunications were attempted between this provider and patient, however failed, due to patient having technical difficulties OR patient did not have access to video capability.  We continued and completed visit with audio only.   Review of Systems    N/A  Cardiac Risk Factors include: advanced age (>49men, >48 women)     Objective:    There were no vitals filed for this visit. There is no height or weight on file to calculate BMI.  Advanced Directives 09/29/2019 07/27/2013  Does Patient Have a Medical Advance Directive? Yes Patient does not have advance directive  Type of Advance Directive [place] in Chart? No - copy requested -  Pre-existing out of facility DNR order (yellow form or pink MOST form) - No    Current Medications (verified) Outpatient Encounter Medications as of 09/29/2019  Medication Sig  . albuterol (PROAIR HFA) 108 (90 Base) MCG/ACT inhaler INHALE 2 PUFFS INTO THE LUNGS EVERY 6 (SIX) HOURS AS NEEDED.  . Ascorbic Acid (VITAMIN C) 100 MG tablet Take 100 mg by mouth daily. Unsure dose  . aspirin EC 81 MG tablet Take 81 mg by mouth once a week.  Marland Kitchen atorvastatin  (LIPITOR) 20 MG tablet Take 10 mg by mouth every other day.   . Azelastine HCl 0.15 % SOLN USE 2 SPRAYS IN EACH NOSTRIL ONCE DAILY (Patient taking differently: As needed)  . calcium citrate-vitamin D 200-200 MG-UNIT TABS Take 2 tablets by mouth daily.    . cetirizine (ZYRTEC) 10 MG tablet Take 10 mg by mouth daily.   . chlorpheniramine (CHLOR-TRIMETON) 4 MG tablet Take 2 tablets (8 mg total) by mouth at bedtime as needed. (Patient taking differently: Take 4 mg by mouth at bedtime as needed. )  . fluticasone furoate-vilanterol (BREO ELLIPTA) 100-25 MCG/INH AEPB Inhale 1 puff into the lungs daily.  . montelukast (SINGULAIR) 10 MG tablet Take 1 tablet (10 mg total) by mouth at bedtime.  . Multiple Vitamin (MULTIVITAMIN) tablet Take 1 tablet by mouth daily.    . Multiple Vitamins-Minerals (ZINC PO) Take by mouth once a week. Unsure dose  . naftifine (NAFTIN) 1 % cream Apply topically daily.  . Omega-3 Fatty Acids (FISH OIL) 1000 MG CAPS Take 1 capsule by mouth once a week.   . triamcinolone cream (KENALOG) 0.1 % Apply 1 application topically 2 (two) times daily. As needed   No facility-administered encounter medications on file as of 09/29/2019.    Allergies (verified) Latex, Naproxen, Peanut oil, and Sulfonamide derivatives   History: Past Medical History:  Diagnosis Date  . Allergic rhinitis, cause unspecified   . Anxiety disorder   . Arthritis   . Asthma without status asthmaticus   . Chronic bronchitis   . Cystoid macular edema of  right eye   . Depression   . Diverticulosis   . ERM OD (epiretinal membrane, right eye)   . Esophageal reflux   . GERD (gastroesophageal reflux disease)   . Heart murmur   . Hx of adenomatous colonic polyps   . IBS (irritable bowel syndrome)   . Nuclear sclerotic cataract of both eyes   . Osteopenia 01/2016   T score -1.7 FRAX 9.5%/1.6% stable from prior DEXA  . Other acute sinusitis   . Other and unspecified hyperlipidemia   . Pituitary adenoma  ([place])   . Rectocele   . Recurrent urinary tract infection   . Retinal hole   . Toenail fungus   . Unspecified asthma(493.90)    Past Surgical History:  Procedure Laterality Date  . BREAST BIOPSY  1976  . CATARACT EXTRACTION Bilateral   . COLONOSCOPY    . COLONOSCOPY WITH PROPOFOL N/A 01/20/2015   Procedure: COLONOSCOPY WITH PROPOFOL;  Surgeon: Manya Silvas, MD;  [facility] END[facility]COPY;  Service: Endoscopy;  Laterality: N/A;  . ESOPHAGOGASTRODUODENOSCOPY    . FLEXIBLE SIGMOIDOSCOPY    . FOOT SURGERY     Sewing needle removed  . TONSILLECTOMY  1950  . WISDOM TOOTH EXTRACTION     Family History  Problem Relation Age of Onset  . Colon cancer Father   . Lung cancer Father        smoker  . Heart attack Father   . Hypertension Father   . Stomach cancer Father   . Bladder Cancer Father   . Hypertension Mother   . Osteoporosis Mother   . Stroke Mother   . Thyroid disease Mother   . Liver cancer Maternal Grandmother   . Colon polyps Brother   . Prostate cancer Maternal Uncle   . Breast cancer Paternal Aunt        Age 37's  . Breast cancer Paternal Grandmother        Age 90's  . Diabetes Paternal Grandmother   . Osteoporosis Maternal Aunt   . Brain cancer Maternal Aunt   . Lung cancer Paternal Grandfather   . Colon polyps Sister   . Thyroid disease Sister   . Colon cancer Other   . Melanoma Niece    Social History   Socioeconomic History  . Marital status: Divorced    Spouse name: Not on file  . Number of children: 2  . Years of education: Not on file  . Highest education level: Bachelor's degree (e.g., BA, AB, BS)  Occupational History  . Occupation: retired Pharmacist, hospital  Tobacco Use  . Smoking status: Passive Smoke Exposure - Never Smoker  . Smokeless tobacco: Never Used  Vaping Use  . Vaping Use: Never used  Substance and Sexual Activity  . Alcohol use: Yes    Alcohol/week: 2.0 standard drinks    Types: 1 Glasses of wine, 1 Cans of beer per week     Comment: 5oz each  . Drug use: No  . Sexual activity: Not Currently    Birth control/protection: Post-menopausal    Comment: 1st intercourse 74 yo-Fewer than 5 partners  Other Topics Concern  . Not on file  Social History Narrative  . Not on file   Social Determinants of Health   Financial Resource Strain: Low Risk   . Difficulty of Paying Living Expenses: Not hard at all  Food Insecurity: No Food Insecurity  . Worried About Charity fundraiser in the Last Year: Never true  . Ran Out of Food  in the Last Year: Never true  Transportation Needs: No Transportation Needs  . Lack of Transportation (Medical): No  . Lack of Transportation (Non-Medical): No  Physical Activity: Inactive  . Days of Exercise per Week: 0 days  . Minutes of Exercise per Session: 0 min  Stress: No Stress Concern Present  . Feeling of Stress : Only a little  Social Connections: Socially Isolated  . Frequency of Communication with Friends and Family: Once a week  . Frequency of Social Gatherings with Friends and Family: Never  . Attends Religious Services: Never  . Active Member of Clubs or Organizations: No  . Attends Archivist Meetings: Never  . Marital Status: Divorced    Tobacco Counseling Counseling given: Not Answered   Clinical Intake:  Pre-visit preparation completed: Yes  Pain : No/denies pain     Nutritional Risks: None Diabetes: No  How often do you need to have someone help you when you read instructions, pamphlets, or other written materials from your doctor or pharmacy?: 1 - Never  Diabetic? No  Interpreter Needed?: No  Information entered by :: [facility], LPN   Activities of Daily Living In your present state of health, do you have any difficulty performing the following activities: 09/29/2019 08/27/2019  Hearing? N N  Vision? N Y  Comment - patient wears eye glasses  Difficulty concentrating or making decisions? N N  Walking or climbing stairs? Y Y  Comment Due  to pain in left upper leg joint. -  Dressing or bathing? N N  Doing errands, shopping? N N  Preparing Food and eating ? N -  Using the Toilet? N -  In the past six months, have you accidently leaked urine? N -  Do you have problems with loss of bowel control? N -  Managing your Medications? N -  Managing your Finances? N -  Housekeeping or managing your Housekeeping? N -  Some recent data might be hidden    Patient Care Team: Brita Romp Dionne Bucy, MD as PCP - General (Family Medicine) Leandrew Koyanagi, MD as Referring Physician (Ophthalmology) Idamae Schuller, Laban Emperor, MD as Referring Physician (Pulmonary Disease) Garrel Ridgel, DPM as Consulting Physician (Podiatry) Oneta Rack, MD (Dermatology)  Indicate any recent Medical Services you may have received from other than [facility] providers in the past year (date may be approximate).     Assessment:   This is a routine wellness examination for [place].  Hearing/Vision screen No exam data present  Dietary issues and exercise activities discussed: Current Exercise Habits: The patient does not participate in regular exercise at present, Exercise limited by: orthopedic condition(s)  Goals    . Exercise 3x per week (30 min per time)     Recommend to start exercising for 3 days a week at least 30 minutes a time.      Depression Screen PHQ 2/9 Scores 08/27/2019 11/28/2018  PHQ - 2 Score 0 0  PHQ- 9 Score 0 4    Fall Risk Fall Risk  09/29/2019 08/27/2019 11/28/2018  Falls in the past year? 0 0 0  Number falls in past yr: 0 0 0  Injury with Fall? 0 0 0    Any stairs in or around the home? Yes  If so, are there any without handrails? No  Home free of loose throw rugs in walkways, pet beds, electrical cords, etc? Yes  Adequate lighting in your home to reduce risk of falls? Yes   ASSISTIVE DEVICES UTILIZED TO PREVENT FALLS:  Life alert? No  Use of a cane, walker or w/c? No  Grab bars in the bathroom? No  Shower chair or  bench in shower? Yes  Elevated toilet seat or a handicapped toilet? Yes    Cognitive Function:     6CIT Screen 08/27/2019  What Year? 0 points  What month? 0 points  What time? 0 points  Count back from 20 0 points  Months in reverse 0 points  Repeat phrase 6 points  Total Score 6    Immunizations Immunization History  Administered Date(s) Administered  . Fluad Quad(high Dose 65+) 12/02/2018  . Influenza Split 10/21/2011, 11/19/2012  . Influenza Whole 11/07/2007, 11/16/2010  . Influenza,inj,Quad PF,6+ Mos 11/12/2013  . PFIZER SARS-COV-2 Vaccination 04/02/2019, 04/29/2019  . Pneumococcal Polysaccharide-23 11/29/2009, 08/27/2019  . Td 04/17/2018  . Zoster 11/19/2009  . Zoster Recombinat (Shingrix) 08/08/2016, 11/07/2016    TDAP status: Up to date Flu Vaccine status: Up to date Pneumococcal vaccine status: Up to date Covid-19 vaccine status: Completed vaccines  Qualifies for Shingles Vaccine? Yes   Zostavax completed Yes   Shingrix Completed?: Yes  Screening Tests Health Maintenance  Topic Date Due  . INFLUENZA VACCINE  09/20/2019  . MAMMOGRAM  01/24/2020  . PNA vac Low Risk Adult (2 of 2 - PCV13) 08/26/2020  . DEXA SCAN  01/25/2021  . COLONOSCOPY  06/03/2024  . TETANUS/TDAP  04/17/2028  . COVID-19 Vaccine  Completed  . Hepatitis C Screening  Completed    Health Maintenance  Health Maintenance Due  Topic Date Due  . INFLUENZA VACCINE  09/20/2019    Colorectal cancer screening: Completed 06/04/19. Repeat every 5 years Mammogram status: Completed 12/05/18. Repeat every year. Scheduled for 10/07/19. Bone Density status: Completed 01/26/16. Results reflect: Bone density results: OSTEOPENIA. Repeat every 5 years. Scheduled for 10/07/19.  Lung Cancer Screening: (Low Dose CT Chest recommended if Age 2-80 years, 30 pack-year currently smoking OR have quit w/in 15years.) does not qualify.    Additional Screening:  Hepatitis C Screening: Up to date  Vision  Screening: Recommended annual ophthalmology exams for early detection of glaucoma and other disorders of the eye. Is the patient up to date with their annual eye exam?  Yes  Who is the provider or what is the name of the office in which the patient attends annual eye exams? Dr Wallace Going If pt is not established with a provider, would they like to be referred to a provider to establish care? No .   Dental Screening: Recommended annual dental exams for proper oral hygiene  Community Resource Referral / Chronic Care Management: CRR required this visit?  No   CCM required this visit?  No      Plan:     I have personally reviewed and noted the following in the patient's chart:   . Medical and social history . Use of alcohol, tobacco or illicit drugs  . Current medications and supplements . Functional ability and status . Nutritional status . Physical activity . Advanced directives . List of other physicians . Hospitalizations, surgeries, and ER visits in previous 12 months . Vitals . Screenings to include cognitive, depression, and falls . Referrals and appointments  In addition, I have reviewed and discussed with patient certain preventive protocols, quality metrics, and best practice recommendations. A written personalized care plan for preventive services as well as general preventive health recommendations were provided to patient.     Legrande Hao [place], [place]   02/20/5850   Nurse Notes: None.

## 2019-09-29 ENCOUNTER — Other Ambulatory Visit: Payer: Self-pay

## 2019-09-29 ENCOUNTER — Encounter: Payer: Medicare Other | Admitting: Family Medicine

## 2019-09-29 ENCOUNTER — Ambulatory Visit (INDEPENDENT_AMBULATORY_CARE_PROVIDER_SITE_OTHER): Payer: Medicare PPO

## 2019-09-29 DIAGNOSIS — Z0000 Encounter for general adult medical examination without abnormal findings: Secondary | ICD-10-CM | POA: Diagnosis not present

## 2019-09-29 NOTE — Patient Instructions (Signed)
[name] , Thank you for taking time to come for your Medicare Wellness Visit. I appreciate your ongoing commitment to your health goals. Please review the following plan we discussed and let me know if I can assist you in the future.   Screening recommendations/referrals: Colonoscopy: Up to date, due 05/2024 Mammogram: Up to date, scheduled 10/07/19 Bone Density: Up to date, scheduled 10/07/19 Recommended yearly ophthalmology/optometry visit for glaucoma screening and checkup Recommended yearly dental visit for hygiene and checkup  Vaccinations: Influenza vaccine: Done 12/02/19 Pneumococcal vaccine: Up to date, Prevnar 13 due 08/26/20 Tdap vaccine: Up to date, due 03/2028 Shingles vaccine: Completed series    Advanced directives: Please bring a copy of your POA (Power of [place]) and/or Living Will to your next appointment.   Conditions/risks identified: Recommend to start exercising for 3 days a week at least 30 minutes a time.  Next appointment: 08/29/20 @ 2:00 PM with Dr Brita Romp    Preventive Care 65 Years and Older, Female Preventive care refers to lifestyle choices and visits with your health care provider that can promote health and wellness. What does preventive care include?  A yearly physical exam. This is also called an annual well check.  Dental exams once or twice a year.  Routine eye exams. Ask your health care provider how often you should have your eyes checked.  Personal lifestyle choices, including:  Daily care of your teeth and gums.  Regular physical activity.  Eating a healthy diet.  Avoiding tobacco and drug use.  Limiting alcohol use.  Practicing safe sex.  Taking low-dose aspirin every day.  Taking vitamin and mineral supplements as recommended by your health care provider. What happens during an annual well check? The services and screenings done by your health care provider during your annual well check will depend on your age, overall health,  lifestyle risk factors, and family history of disease. Counseling  Your health care provider may ask you questions about your:  Alcohol use.  Tobacco use.  Drug use.  Emotional well-being.  Home and relationship well-being.  Sexual activity.  Eating habits.  History of falls.  Memory and ability to understand (cognition).  Work and work Statistician.  Reproductive health. Screening  You may have the following tests or measurements:  Height, weight, and BMI.  Blood pressure.  Lipid and cholesterol levels. These may be checked every 5 years, or more frequently if you are over 83 years old.  Skin check.  Lung cancer screening. You may have this screening every year starting at age 10 if you have a 30-pack-year history of smoking and currently smoke or have quit within the past 15 years.  Fecal occult blood test (FOBT) of the stool. You may have this test every year starting at age 24.  Flexible sigmoidoscopy or colonoscopy. You may have a sigmoidoscopy every 5 years or a colonoscopy every 10 years starting at age 12.  Hepatitis C blood test.  Hepatitis B blood test.  Sexually transmitted disease (STD) testing.  Diabetes screening. This is done by checking your blood sugar (glucose) after you have not eaten for a while (fasting). You may have this done every 1-3 years.  Bone density scan. This is done to screen for osteoporosis. You may have this done starting at age 66.  Mammogram. This may be done every 1-2 years. Talk to your health care provider about how often you should have regular mammograms. Talk with your health care provider about your test results, treatment options, and  if necessary, the need for more tests. Vaccines  Your health care provider may recommend certain vaccines, such as:  Influenza vaccine. This is recommended every year.  Tetanus, diphtheria, and acellular pertussis (Tdap, Td) vaccine. You may need a Td booster every 10 years.  Zoster  vaccine. You may need this after age 60.  Pneumococcal 13-valent conjugate (PCV13) vaccine. One dose is recommended after age 9.  Pneumococcal polysaccharide (PPSV23) vaccine. One dose is recommended after age 73. Talk to your health care provider about which screenings and vaccines you need and how often you need them. This information is not intended to replace advice given to you by your health care provider. Make sure you discuss any questions you have with your health care provider. Document Released: 03/04/2015 Document Revised: 10/26/2015 Document Reviewed: 12/07/2014 Elsevier Interactive Patient Education  2017 [place] Prevention in the Home Falls can cause injuries. They can happen to people of all ages. There are many things you can do to make your home safe and to help prevent falls. What can I do on the outside of my home?  Regularly fix the edges of walkways and driveways and fix any cracks.  Remove anything that might make you trip as you walk through a door, such as a raised step or threshold.  Trim any bushes or trees on the path to your home.  Use bright outdoor lighting.  Clear any walking paths of anything that might make someone trip, such as rocks or tools.  Regularly check to see if handrails are loose or broken. Make sure that both sides of any steps have handrails.  Any raised decks and porches should have guardrails on the edges.  Have any leaves, snow, or ice cleared regularly.  Use sand or salt on walking paths during winter.  Clean up any spills in your garage right away. This includes oil or grease spills. What can I do in the bathroom?  Use night lights.  Install grab bars by the toilet and in the tub and shower. Do not use towel bars as grab bars.  Use non-skid mats or decals in the tub or shower.  If you need to sit down in the shower, use a plastic, non-slip stool.  Keep the floor dry. Clean up any water that spills on the  floor as soon as it happens.  Remove soap buildup in the tub or shower regularly.  Attach bath mats securely with double-sided non-slip rug tape.  Do not have throw rugs and other things on the floor that can make you trip. What can I do in the bedroom?  Use night lights.  Make sure that you have a light by your bed that is easy to reach.  Do not use any sheets or blankets that are too big for your bed. They should not hang down onto the floor.  Have a firm chair that has side arms. You can use this for support while you get dressed.  Do not have throw rugs and other things on the floor that can make you trip. What can I do in the kitchen?  Clean up any spills right away.  Avoid walking on wet floors.  Keep items that you use a lot in easy-to-reach places.  If you need to reach something above you, use a strong step stool that has a grab bar.  Keep electrical cords out of the way.  Do not use floor polish or wax that makes floors slippery. If you  must use wax, use non-skid floor wax.  Do not have throw rugs and other things on the floor that can make you trip. What can I do with my stairs?  Do not leave any items on the stairs.  Make sure that there are handrails on both sides of the stairs and use them. Fix handrails that are broken or loose. Make sure that handrails are as long as the stairways.  Check any carpeting to make sure that it is firmly attached to the stairs. Fix any carpet that is loose or worn.  Avoid having throw rugs at the top or bottom of the stairs. If you do have throw rugs, attach them to the floor with carpet tape.  Make sure that you have a light switch at the top of the stairs and the bottom of the stairs. If you do not have them, ask someone to add them for you. What else can I do to help prevent falls?  Wear shoes that:  Do not have high heels.  Have rubber bottoms.  Are comfortable and fit you well.  Are closed at the toe. Do not wear  sandals.  If you use a stepladder:  Make sure that it is fully opened. Do not climb a closed stepladder.  Make sure that both sides of the stepladder are locked into place.  Ask someone to hold it for you, if possible.  Clearly mark and make sure that you can see:  Any grab bars or handrails.  First and last steps.  Where the edge of each step is.  Use tools that help you move around (mobility aids) if they are needed. These include:  Canes.  Walkers.  Scooters.  Crutches.  Turn on the lights when you go into a dark area. Replace any light bulbs as soon as they burn out.  Set up your furniture so you have a clear path. Avoid moving your furniture around.  If any of your floors are uneven, fix them.  If there are any pets around you, be aware of where they are.  Review your medicines with your doctor. Some medicines can make you feel dizzy. This can increase your chance of falling. Ask your doctor what other things that you can do to help prevent falls. This information is not intended to replace advice given to you by your health care provider. Make sure you discuss any questions you have with your health care provider. Document Released: 12/02/2008 Document Revised: 07/14/2015 Document Reviewed: 03/12/2014 Elsevier Interactive Patient Education  2017 [employer].

## 2019-10-05 ENCOUNTER — Encounter: Payer: Medicare PPO | Admitting: Obstetrics & Gynecology

## 2019-10-07 ENCOUNTER — Ambulatory Visit
Admission: RE | Admit: 2019-10-07 | Discharge: 2019-10-07 | Disposition: A | Discharge status: Home / Self Care | Payer: Medicare PPO | Source: Ambulatory Visit | Attending: Obstetrics & Gynecology | Admitting: Obstetrics & Gynecology

## 2019-10-07 ENCOUNTER — Other Ambulatory Visit: Payer: Self-pay

## 2019-10-07 DIAGNOSIS — Z1231 Encounter for screening mammogram for malignant neoplasm of breast: Secondary | ICD-10-CM

## 2019-10-07 DIAGNOSIS — M8580 Other specified disorders of bone density and structure, unspecified site: Secondary | ICD-10-CM

## 2019-10-08 NOTE — Telephone Encounter (Signed)
Patient informed with my chart message results.

## 2019-10-20 ENCOUNTER — Ambulatory Visit: Payer: Medicare Other | Admitting: Podiatry

## 2019-10-23 NOTE — Telephone Encounter (Signed)
Spoke with patient and informed her. °

## 2019-11-16 ENCOUNTER — Telehealth: Payer: Self-pay

## 2019-11-16 NOTE — Telephone Encounter (Signed)
Copied from [place] [id number]. Topic: General - Other >> Nov 13, 2019 10:23 AM Yvette Rack wrote: Reason for CRM: Pt would like to know if she fits in the category of being immunocompromised since [facility] is only offering the booster/3rd dose to pt diagnosed as being immunocompromised. Pt requests call back. Cb# [phone]

## 2019-11-16 NOTE — Telephone Encounter (Signed)
Pt advised.  She is going to [facility] to get her 3rd dose.  Thanks,   -Mickel Baas

## 2019-11-16 NOTE — Telephone Encounter (Signed)
She is not immunocompromised, but given the new recommendations for boosters for people who received [place], she can get a 3rd dose with [place] at any time as she is >65.

## 2019-12-14 ENCOUNTER — Other Ambulatory Visit: Payer: Self-pay | Admitting: Family Medicine

## 2019-12-14 ENCOUNTER — Ambulatory Visit: Payer: Self-pay

## 2019-12-14 ENCOUNTER — Encounter: Payer: Self-pay | Admitting: Emergency Medicine

## 2019-12-14 ENCOUNTER — Emergency Department
Admission: EM | Admit: 2019-12-14 | Discharge: 2019-12-14 | Disposition: A | Discharge status: Home / Self Care | Payer: Medicare PPO | Attending: Emergency Medicine | Admitting: Emergency Medicine

## 2019-12-14 ENCOUNTER — Other Ambulatory Visit: Payer: Self-pay

## 2019-12-14 DIAGNOSIS — R5383 Other fatigue: Secondary | ICD-10-CM | POA: Diagnosis not present

## 2019-12-14 DIAGNOSIS — R0789 Other chest pain: Secondary | ICD-10-CM | POA: Insufficient documentation

## 2019-12-14 DIAGNOSIS — R002 Palpitations: Secondary | ICD-10-CM | POA: Insufficient documentation

## 2019-12-14 DIAGNOSIS — Z5321 Procedure and treatment not carried out due to patient leaving prior to being seen by health care provider: Secondary | ICD-10-CM | POA: Diagnosis not present

## 2019-12-14 LAB — BASIC METABOLIC PANEL
Anion gap: 9 (ref 5–15)
BUN: 12 mg/dL (ref 8–23)
CO2: 25 mmol/L (ref 22–32)
Calcium: 9.4 mg/dL (ref 8.9–10.3)
Chloride: 103 mmol/L (ref 98–111)
Creatinine, Ser: 0.58 mg/dL (ref 0.44–1.00)
GFR, Estimated: >60 mL/min (ref >60)
Glucose, Bld: 102 mg/dL — ABNORMAL HIGH (ref 70–99)
Potassium: 3.8 mmol/L (ref 3.5–5.1)
Sodium: 137 mmol/L (ref 135–145)

## 2019-12-14 LAB — CBC
HCT: 37.4 % (ref 36.0–46.0)
Hemoglobin: 12.3 g/dL (ref 12.0–15.0)
MCH: 30.9 pg (ref 26.0–34.0)
MCHC: 32.9 g/dL (ref 30.0–36.0)
MCV: 94 fL (ref 80.0–100.0)
Platelets: 221 10*3/uL (ref 150–400)
RBC: 3.98 MIL/uL (ref 3.87–5.11)
RDW: 13.3 % (ref 11.5–15.5)
WBC: 5.7 10*3/uL (ref 4.0–10.5)
nRBC: 0 % (ref 0.0–0.2)

## 2019-12-14 NOTE — Telephone Encounter (Signed)
FYI

## 2019-12-14 NOTE — Telephone Encounter (Signed)
Requested Prescriptions  Pending Prescriptions Disp Refills  . montelukast (SINGULAIR) 10 MG tablet [Pharmacy Med Name: MONTELUKAST SODIUM 10 MG TAB] 90 tablet 0    Sig: TAKE 1 TABLET BY MOUTH AT BEDTIME     Pulmonology:  Leukotriene Inhibitors Passed - 12/14/2019 11:53 AM      Passed - Valid encounter within last 12 months    Recent Outpatient Visits          9 months ago Cough   [place], Wendee Beavers, PA-C   1 year ago Gastroesophageal reflux disease without esophagitis   [facility] Bacigalupo, Dionne Bucy, MD      Future Appointments            In 8 months Bacigalupo, Dionne Bucy, MD [facility], [place]

## 2019-12-14 NOTE — ED Triage Notes (Signed)
Pt in from home w/complaints of ongoing fatigue x few weeks, also reporting some heart palpitations/"thuds of heartbeats" during this same time. States she got her [place] booster on 9/27, and had a cold-like illness a few wks ago, as well as the persistent symptoms she presents with today. Denies any present cp, but states her L arm hurts at injection site, and she has had some intermittent cp throughout past few wks. She does report taking some new vitamins, and unknowingly double-dosing Citracal for a few wks.

## 2019-12-14 NOTE — Telephone Encounter (Signed)
Patient called stating that she has had her [place] booster September 27th She states that her left arm was sore and she felt fatigued after.  She states that for past two weeks after having some cold symptoms she is experiencing chest tugging/tightness.  She has left arm pain and a sense that she can not get enough air. She feels a heavy beat in her chest. She feels extreme fatigue.She reportts having stuffy nose. She has history of high cholesterol and strong family Hx of heart/stroke.  She states she feels that he symptoms are constant but worse and more noticeable at night. Per protocol patient will go to ER for evaluation of symptoms. Care advice read to patient.  She verbalized understanding of all information.  Reason for Disposition . [1] Chest pain (or "angina") comes and goes AND [2] is happening more often (increasing in frequency) or getting worse (increasing in severity) (Exception: chest pains that last only a few seconds)  Answer Assessment - Initial Assessment Questions 1. LOCATION: "Where does it hurt?"       Chest tight constant 2. RADIATION: "Does the pain go anywhere else?" (e.g., into neck, jaw, arms, back)     Arm left 3. ONSET: "When did the chest pain begin?" (Minutes, hours or days)      In the last 2 weeks with cold symptoms 4. PATTERN "Does the pain come and go, or has it been constant since it started?"  "Does it get worse with exertion?"      Constant worse at night 5. DURATION: "How long does it last" (e.g., seconds, minutes, hours)     fatigue all day 6. SEVERITY: "How bad is the pain?"  (e.g., Scale 1-10; mild, moderate, or severe)    - MILD (1-3): doesn't interfere with normal activities     - MODERATE (4-7): interferes with normal activities or awakens from sleep    - SEVERE (8-10): excruciating pain, unable to do any normal activities       Tightness feels like not getting enough air 7. CARDIAC RISK FACTORS: "Do you have any history of heart problems or risk  factors for heart disease?" (e.g., angina, prior heart attack; diabetes, high blood pressure, high cholesterol, smoker, or strong family history of heart disease)     Murmur, high cholesteral, family history 8. PULMONARY RISK FACTORS: "Do you have any history of lung disease?"  (e.g., blood clots in lung, asthma, emphysema, birth control pills)     Chronic bronchitis and asthma 9. CAUSE: "What do you think is causing the chest pain?"     Feels that it may have been boster 10. OTHER SYMPTOMS: "Do you have any other symptoms?" (e.g., dizziness, nausea, vomiting, sweating, fever, difficulty breathing, cough)       Not getting enough deep breath Stuffy nose 11. PREGNANCY: "Is there any chance you are pregnant?" "When was your last menstrual period?"       N/A  Protocols used: CHEST PAIN-A-AH

## 2019-12-14 NOTE — Telephone Encounter (Signed)
Noted  

## 2019-12-15 ENCOUNTER — Telehealth: Payer: Self-pay | Admitting: Emergency Medicine

## 2019-12-15 NOTE — Telephone Encounter (Signed)
Called patient due to left emergency department before provider exam to inquire about condition and follow up plans. No answer and no voicemail 

## 2019-12-16 ENCOUNTER — Telehealth: Payer: Self-pay

## 2019-12-16 NOTE — Telephone Encounter (Signed)
Copied from [place] [id number]. Topic: Referral - Request for Referral >> Dec 16, 2019  1:43 PM Gillis Ends D wrote: Has patient seen PCP for this complaint? Yes.   *If NO, is insurance requiring patient see PCP for this issue before PCP can refer them? Referral for which specialty: Cardiology Preferred provider/office: Dr. Jacelyn Grip at [facility] (fax number [phone] wants to apply her [place] number [id number]) Reason for referral: She wants to see a Specialist since she got the Vaccine

## 2019-12-17 NOTE — Telephone Encounter (Signed)
We have not seen her for this.  Often, we can do some of the work-up first before sending a referral.  We need to evaluate before sending referral.

## 2019-12-18 NOTE — Telephone Encounter (Signed)
Made apt with Adriana for 12/23/2019.  Pt states she can only come in on Wednesday and Fridays.   Thanks,   -Mickel Baas

## 2019-12-23 ENCOUNTER — Ambulatory Visit: Payer: Medicare PPO | Admitting: Physician Assistant

## 2019-12-23 ENCOUNTER — Encounter: Payer: Self-pay | Admitting: Physician Assistant

## 2019-12-23 ENCOUNTER — Other Ambulatory Visit: Payer: Self-pay

## 2019-12-23 VITALS — BP 101/66 | HR 80 | Temp 98.0°F | Wt 170.8 lb

## 2019-12-23 DIAGNOSIS — R002 Palpitations: Secondary | ICD-10-CM | POA: Diagnosis not present

## 2019-12-23 DIAGNOSIS — F4320 Adjustment disorder, unspecified: Secondary | ICD-10-CM

## 2019-12-23 MED ORDER — SERTRALINE HCL 50 MG PO TABS: 50.0000 mg | ORAL_TABLET | Freq: Every day | ORAL | 0 refills | Status: DC

## 2019-12-23 NOTE — Progress Notes (Signed)
Established patient visit   Patient: [name]   DOB: 1945-11-05   74 y.o. Female  MRN: [id number] Visit Date: 12/23/2019  Today's healthcare provider: Trinna Post, PA-C   Chief Complaint  Patient presents with  . Chest Pain  I,Marchella Hibbard M Krissia Schreier,acting as a scribe for Trinna Post, PA-C.,have documented all relevant documentation on the behalf of Trinna Post, PA-C,as directed by  Trinna Post, PA-C while in the presence of Trinna Post, PA-C.  Subjective    Chest Pain  This is a new problem. The current episode started more than 1 month ago. The problem occurs intermittently. The problem has been unchanged. The pain is at a severity of 4/10. The pain is mild. The quality of the pain is described as heavy, pressure, tightness and squeezing. The pain does not radiate. Associated symptoms include irregular heartbeat, palpitations and shortness of breath.   Patient has an upcoming cardiology appointment on 01/21/2020 with Dr. Mina Marble in [place].   She is currently reporting significant social stressors with her family, mainly issues involving her sons. This is causing her significant distress, anxiety and worry. She reports previously attending counseling but is not currently. She is interested in starting a medication.       Medications: Outpatient Medications Prior to Visit  Medication Sig  . albuterol (PROAIR HFA) 108 (90 Base) MCG/ACT inhaler INHALE 2 PUFFS INTO THE LUNGS EVERY 6 (SIX) HOURS AS NEEDED.  . Ascorbic Acid (VITAMIN C) 100 MG tablet Take 100 mg by mouth daily. Unsure dose  . aspirin EC 81 MG tablet Take 81 mg by mouth once a week.  Marland Kitchen atorvastatin (LIPITOR) 20 MG tablet Take 10 mg by mouth every other day.   . Azelastine HCl 0.15 % SOLN USE 2 SPRAYS IN EACH NOSTRIL ONCE DAILY (Patient taking differently: As needed)  . calcium citrate-vitamin D 200-200 MG-UNIT TABS Take 2 tablets by mouth daily.    . cetirizine (ZYRTEC) 10 MG tablet Take 10 mg by  mouth daily.   . chlorpheniramine (CHLOR-TRIMETON) 4 MG tablet Take 2 tablets (8 mg total) by mouth at bedtime as needed. (Patient taking differently: Take 4 mg by mouth at bedtime as needed. )  . fluticasone furoate-vilanterol (BREO ELLIPTA) 100-25 MCG/INH AEPB Inhale 1 puff into the lungs daily.  . montelukast (SINGULAIR) 10 MG tablet TAKE 1 TABLET BY MOUTH AT BEDTIME  . Multiple Vitamin (MULTIVITAMIN) tablet Take 1 tablet by mouth daily.    . Multiple Vitamins-Minerals (ZINC PO) Take by mouth once a week. Unsure dose  . naftifine (NAFTIN) 1 % cream Apply topically daily.  . Omega-3 Fatty Acids (FISH OIL) 1000 MG CAPS Take 1 capsule by mouth once a week.   . triamcinolone cream (KENALOG) 0.1 % Apply 1 application topically 2 (two) times daily. As needed   No facility-administered medications prior to visit.    Review of Systems  Constitutional: Negative.   Respiratory: Positive for chest tightness and shortness of breath.   Cardiovascular: Positive for chest pain and palpitations.      Objective    BP 101/66 (BP Location: Left Arm, Patient Position: Sitting, Cuff Size: Large)   Pulse 80   Temp 98 F (36.7 C) (Oral)   Wt 170 lb 12.8 oz (77.5 kg)   SpO2 98%   BMI 30.26 kg/m    Physical Exam Constitutional:      Appearance: Normal appearance.  Cardiovascular:     Rate and Rhythm: Normal rate  and regular rhythm.     Heart sounds: Normal heart sounds.  Pulmonary:     Effort: Pulmonary effort is normal.     Breath sounds: Normal breath sounds.  Skin:    General: Skin is warm and dry.  Neurological:     Mental Status: She is alert and oriented to person, place, and time. Mental status is at baseline.  Psychiatric:        Mood and Affect: Mood normal.        Behavior: Behavior normal.       No results found for any visits on 12/23/19.  Assessment & Plan    1. Palpitations  EKG stable. Referral placed for insurance purposes. Follow up with cardiology.   - EKG  12-Lead - Ambulatory referral to Cardiology  2. Adjustment disorder, unspecified type  Will Start zoloft 50  mg daily Discussed potential side effects, incl GI upset, sexual dysfunction, increased anxiety, and SI Discussed that it can take 6-8 weeks to reach full efficacy Contracted for safety - no SI/HI Discussed synergistic effects of medications and therapy   - sertraline (ZOLOFT) 50 MG tablet; Take 1 tablet (50 mg total) by mouth daily.  Dispense: 90 tablet; Refill: 0   No follow-ups on file.      ITrinna Post, PA-C, have reviewed all documentation for this visit. The documentation on 12/23/19 for the exam, diagnosis, procedures, and orders are all accurate and complete.  The entirety of the information documented in the History of Present Illness, Review of Systems and Physical Exam were personally obtained by me. Portions of this information were initially documented by [facility] and reviewed by me for thoroughness and accuracy.     Paulene Floor  [facility] [phone] (phone) [phone] (fax)  [place]

## 2019-12-23 NOTE — Patient Instructions (Signed)
Buspar Zoloft

## 2020-01-06 ENCOUNTER — Other Ambulatory Visit: Payer: Self-pay | Admitting: Family Medicine

## 2020-01-19 ENCOUNTER — Other Ambulatory Visit: Payer: Self-pay

## 2020-01-19 ENCOUNTER — Ambulatory Visit: Payer: Medicare PPO | Admitting: Podiatry

## 2020-01-19 ENCOUNTER — Encounter: Payer: Self-pay | Admitting: Podiatry

## 2020-01-19 ENCOUNTER — Ambulatory Visit: Payer: Medicare PPO

## 2020-01-19 DIAGNOSIS — M778 Other enthesopathies, not elsewhere classified: Secondary | ICD-10-CM | POA: Diagnosis not present

## 2020-01-19 DIAGNOSIS — M2041 Other hammer toe(s) (acquired), right foot: Secondary | ICD-10-CM | POA: Diagnosis not present

## 2020-01-19 DIAGNOSIS — M2012 Hallux valgus (acquired), left foot: Secondary | ICD-10-CM

## 2020-01-19 DIAGNOSIS — M2011 Hallux valgus (acquired), right foot: Secondary | ICD-10-CM

## 2020-01-19 DIAGNOSIS — L989 Disorder of the skin and subcutaneous tissue, unspecified: Secondary | ICD-10-CM

## 2020-01-19 DIAGNOSIS — M2042 Other hammer toe(s) (acquired), left foot: Secondary | ICD-10-CM

## 2020-01-19 NOTE — Progress Notes (Signed)
Subjective:  Patient ID: [name], female    DOB: May 28, 1945,  MRN: [id number] HPI Chief Complaint  Patient presents with  . Foot Pain    Plantar forefoot bilateral - feeling of walking on rocks x several months, no injury, worn orthotics since 1990's  . New Patient (Initial Visit)    Est pt 3+    74 y.o. female presents with the above complaint.   ROS: Denies fever chills nausea vomiting muscle aches pains calf pain back pain chest pain shortness of breath.  Past Medical History:  Diagnosis Date  . Allergic rhinitis, cause unspecified   . Anxiety disorder   . Arthritis   . Asthma without status asthmaticus   . Chronic bronchitis   . Cystoid macular edema of right eye   . Depression   . Diverticulosis   . ERM OD (epiretinal membrane, right eye)   . Esophageal reflux   . GERD (gastroesophageal reflux disease)   . Heart murmur   . Hx of adenomatous colonic polyps   . IBS (irritable bowel syndrome)   . Nuclear sclerotic cataract of both eyes   . Osteopenia 01/2016   T score -1.7 FRAX 9.5%/1.6% stable from prior DEXA  . Other acute sinusitis   . Other and unspecified hyperlipidemia   . Pituitary adenoma ([place])   . Rectocele   . Recurrent urinary tract infection   . Retinal hole   . Toenail fungus   . Unspecified asthma(493.90)    Past Surgical History:  Procedure Laterality Date  . BREAST BIOPSY  1976  . CATARACT EXTRACTION Bilateral   . COLONOSCOPY    . COLONOSCOPY WITH PROPOFOL N/A 01/20/2015   Procedure: COLONOSCOPY WITH PROPOFOL;  Surgeon: Manya Silvas, MD;  Location[facility] ENDOSCOPY;  Service: Endoscopy;  Laterality: N/A;  . ESOPHAGOGASTRODUODENOSCOPY    . FLEXIBLE SIGMOIDOSCOPY    . FOOT SURGERY     Sewing needle removed  . TONSILLECTOMY  1950  . WISDOM TOOTH EXTRACTION      Current Outpatient Medications:  .  albuterol (PROAIR HFA) 108 (90 Base) MCG/ACT inhaler, INHALE 2 PUFFS INTO THE LUNGS EVERY 6 (SIX) HOURS AS NEEDED., Disp: 8.5 each, Rfl: 0 .   amoxicillin (AMOXIL) 500 MG capsule, SMARTSIG:4 Capsule(s) By Mouth, Disp: , Rfl:  .  Ascorbic Acid (VITAMIN C) POWD, Take by mouth., Disp: , Rfl:  .  aspirin EC 81 MG tablet, Take 81 mg by mouth once a week., Disp: , Rfl:  .  atorvastatin (LIPITOR) 20 MG tablet, Take 10 mg by mouth every other day. , Disp: , Rfl:  .  Azelastine HCl 0.15 % SOLN, USE 2 SPRAYS IN EACH NOSTRIL ONCE DAILY (Patient taking differently: As needed), Disp: 30 mL, Rfl: 5 .  calcium citrate-vitamin D 200-200 MG-UNIT TABS, Take 2 tablets by mouth daily.  , Disp: , Rfl:  .  cetirizine (ZYRTEC) 10 MG tablet, Take 10 mg by mouth daily. , Disp: , Rfl:  .  chlorpheniramine (CHLOR-TRIMETON) 4 MG tablet, Take 2 tablets (8 mg total) by mouth at bedtime as needed. (Patient taking differently: Take 4 mg by mouth at bedtime as needed. ), Disp: 14 tablet, Rfl:  .  fluticasone furoate-vilanterol (BREO ELLIPTA) 100-25 MCG/INH AEPB, Inhale 1 puff into the lungs daily., Disp: 1 each, Rfl: 0 .  montelukast (SINGULAIR) 10 MG tablet, TAKE 1 TABLET BY MOUTH AT BEDTIME, Disp: 90 tablet, Rfl: 0 .  Multiple Vitamin (MULTIVITAMIN) tablet, Take 1 tablet by mouth daily.  , Disp: ,  Rfl:  .  Multiple Vitamins-Minerals (ZINC PO), Take by mouth once a week. Unsure dose, Disp: , Rfl:  .  naftifine (NAFTIN) 1 % cream, Apply topically daily., Disp: , Rfl:  .  Omega-3 Fatty Acids (FISH OIL) 1000 MG CAPS, Take 1 capsule by mouth once a week. , Disp: , Rfl:  .  sertraline (ZOLOFT) 50 MG tablet, Take 1 tablet (50 mg total) by mouth daily., Disp: 90 tablet, Rfl: 0 .  triamcinolone cream (KENALOG) 0.1 %, Apply 1 application topically 2 (two) times daily. As needed, Disp: , Rfl:   Allergies  Allergen Reactions  . Latex Hives  . Naproxen     Ulcers per pt  . Peanut Oil     Other reaction(s): Other (See Comments) Erythema and hot sensation  . Sulfonamide Derivatives Hives   Review of Systems Objective:  There were no vitals filed for this  visit.  General: Well developed, nourished, in no acute distress, alert and oriented x3   Dermatological: Skin is warm, dry and supple bilateral. Nails x 10 are well maintained; remaining integument appears unremarkable at this time. There are no open sores, no preulcerative lesions, no rash or signs of infection present.  2 solitary porokeratotic lesions beneath the third fourth metatarsals bilaterally.  This is most likely resulting in her compensation and severe pain throughout the foot.  Vascular: Dorsalis Pedis artery and Posterior Tibial artery pedal pulses are 2/4 bilateral with immedate capillary fill time. Pedal hair growth present. No varicosities and no lower extremity edema present bilateral.   Neruologic: Grossly intact via light touch bilateral. Vibratory intact via tuning fork bilateral. Protective threshold with Semmes Wienstein monofilament intact to all pedal sites bilateral. Patellar and Achilles deep tendon reflexes 2+ bilateral. No Babinski or clonus noted bilateral.   Musculoskeletal: No gross boney pedal deformities bilateral. No pain, crepitus, or limitation noted with foot and ankle range of motion bilateral. Muscular strength 5/5 in all groups tested bilateral.  Gait: Unassisted, Nonantalgic.    Radiographs:  She refused  Assessment & Plan:   Assessment: Probable compensation secondary to benign skin lesions.    Plan: Discussed etiology pathology conservative surgical therapies sharply debrided these lesions today placed salicylic acid under occlusion we will follow-up with her in a few weeks to see how well the rest of her foot is doing.  May need to consider neuropathy or neuromas.     Naseer Hearn T. [place], [place]

## 2020-01-25 ENCOUNTER — Telehealth: Payer: Self-pay | Admitting: Family Medicine

## 2020-01-25 NOTE — Telephone Encounter (Signed)
Pt stated she seems to be doing ok and decided not to take her Rx for sertraline (ZOLOFT) 50 MG tablet And cancel her f/u appt/ please advise

## 2020-01-25 NOTE — Telephone Encounter (Signed)
Noted. Looks like next appt isn't until 2022 anyways.

## 2020-02-05 ENCOUNTER — Ambulatory Visit: Payer: Self-pay | Admitting: Family Medicine

## 2020-04-04 ENCOUNTER — Ambulatory Visit: Payer: Self-pay | Admitting: *Deleted

## 2020-04-04 NOTE — Telephone Encounter (Signed)
Ok glad they are able to see her.  They should be able to do UA also for dysuria, but if she needs [place], she can give [place] a call back to schedule.

## 2020-04-04 NOTE — Telephone Encounter (Signed)
Appt with GYN 04/05/2020

## 2020-04-04 NOTE — Telephone Encounter (Signed)
Patient complains of LR back pain- patient states she saw blood- not sure source-  on underwear- Saturday. Red to dark red in color.No bleeding since- although pain in lower back has continued. Patient does say she has slight burning with urination for 2-3 days.  ? Vaginal source vs urinary. Appointment scheduled- patient wants to check with GYN and see if she can be seen there- she will call back if she is able to get appointment.  Reason for Disposition . Postmenopausal vaginal bleeding  Answer Assessment - Initial Assessment Questions 1. AMOUNT: "Describe the bleeding that you are having." "How much bleeding is there?"    - SPOTTING: spotting, or pinkish / brownish mucous discharge; does not fill panti-liner or pad    - MILD:  less than 1 pad / hour; less than patient's usual menstrual bleeding   - MODERATE: 1-2 pads / hour; 1 menstrual cup every 6 hours; small-medium blood clots (e.g., pea, grape, small coin)   - SEVERE: soaking 2 or more pads/hour for 2 or more hours; 1 menstrual cup every 2 hours; bleeding not contained by pads or continuous red blood from vagina; large blood clots (e.g., golf ball, large coin)      Spotting- possible 2. ONSET: "When did the bleeding begin?" "Is it continuing now?"     Saturday only 3. MENOPAUSE: "When was your last menstrual period?"      1996-98 4. ABDOMINAL PAIN: "Do you have any pain?" "How bad is the pain?"  (e.g., Scale 1-10; mild, moderate, or severe)   - MILD (1-3): doesn't interfere with normal activities, abdomen soft and not tender to touch    - MODERATE (4-7): interferes with normal activities or awakens from sleep, tender to touch    - SEVERE (8-10): excruciating pain, doubled over, unable to do any normal activities     LR back- pain, 4-5, pain started Friday- pain has continued  5. BLOOD THINNERS: "Do you take any blood thinners?" (e.g., Coumadin/warfarin, Pradaxa/dabigatran, aspirin)     no 6. HORMONES: "Are you taking any hormone  medications, prescription or OTC?" (e.g., birth control pills, estrogen)     no 7. CAUSE: "What do you think is causing the bleeding?" (e.g., recent gyn surgery, recent gyn procedure; known bleeding disorder, uterine cancer)       unsure 8. HEMODYNAMIC STATUS: "Are you weak or feeling lightheaded?" If Yes, ask: "Can you stand and walk normally?"       no 9. OTHER SYMPTOMS: "What other symptoms are you having with the bleeding?" (e.g., back pain, burning with urination, fever)     Slight burning and frequency for 3 days  Protocols used: VAGINAL BLEEDING - POSTMENOPAUSAL-A-AH

## 2020-04-05 ENCOUNTER — Other Ambulatory Visit: Payer: Self-pay | Admitting: *Deleted

## 2020-04-05 ENCOUNTER — Encounter: Payer: Self-pay | Admitting: Nurse Practitioner

## 2020-04-05 ENCOUNTER — Other Ambulatory Visit: Payer: Self-pay

## 2020-04-05 ENCOUNTER — Ambulatory Visit: Payer: Medicare PPO | Admitting: Nurse Practitioner

## 2020-04-05 ENCOUNTER — Ambulatory Visit: Payer: Self-pay | Admitting: Adult Health

## 2020-04-05 VITALS — BP 118/68 | HR 68 | Resp 16 | Wt 176.0 lb

## 2020-04-05 DIAGNOSIS — R300 Dysuria: Secondary | ICD-10-CM

## 2020-04-05 DIAGNOSIS — N920 Excessive and frequent menstruation with regular cycle: Secondary | ICD-10-CM

## 2020-04-05 NOTE — Progress Notes (Signed)
GYNECOLOGY  VISIT  CC:   Noticed blood in panties, 04/02/2020. It was toward the front, but she didn't know where the blood came from. It was a streak that was about a inch and a half long, bright red. When she wiped after urination she saw "pink".  HPI: 75 y.o. G66P2002 Divorced White or Caucasian female here for bleeding, burning with urination & frequency.  Does not feel like a yeast infection, Urinary symptoms are mild and feels hyper awareof that area now. The burning is when she is not urinating. Not sexually active   The day before she noticed blood, she worked out hard with weights and wonders if it is coincencidental   GYNECOLOGIC HISTORY: No LMP recorded. Patient is postmenopausal. Contraception: post menopausal Menopausal hormone therapy: none  Patient Active Problem List   Diagnosis Date Noted  . 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 History of pituitary adenoma 12/09/2013  . PVD (posterior vitreous detachment), bilateral 12/09/2013  . Pituitary adenoma ([place]) 12/09/2013  . Rectocele   . GERD (gastroesophageal reflux disease)   . Osteopenia 07/21/2010  . Disorder of bone and cartilage 07/21/2010  . PERSONAL HX COLONIC POLYPS 07/05/2008  . Hyperlipidemia 01/03/2007  . ALLERGIC RHINITIS 01/03/2007  . Mild persistent asthma with allergic rhinitis without complication [id number]/96/7591    Past Medical History:  Diagnosis Date  . Allergic rhinitis, cause unspecified   . Anxiety disorder   . Arthritis   . Asthma without status asthmaticus   . Chronic bronchitis   . Cystoid macular edema of right eye   . Depression   . Diverticulitis   . Diverticulosis   . ERM OD (epiretinal membrane, right eye)   . Esophageal reflux   . GERD (gastroesophageal reflux disease)   . Heart murmur   . Hx of adenomatous colonic polyps   . IBS (irritable bowel syndrome)   . Nuclear sclerotic cataract of both eyes   . Osteopenia 01/2016   T score -1.7 FRAX 9.5%/1.6% stable from prior DEXA   . Other acute sinusitis   . Other and unspecified hyperlipidemia   . Pituitary adenoma ([place])   . Rectocele   . Recurrent urinary tract infection   . Retinal hole   . Toenail fungus   . Unspecified asthma(493.90)     Past Surgical History:  Procedure Laterality Date  . BREAST BIOPSY  1976  . CATARACT EXTRACTION Bilateral   . COLONOSCOPY    . COLONOSCOPY WITH PROPOFOL N/A 01/20/2015   Procedure: COLONOSCOPY WITH PROPOFOL;  Surgeon: Manya Silvas, MD;  Location[facility] ENDOSCOPY;  Service: Endoscopy;  Laterality: N/A;  . ESOPHAGOGASTRODUODENOSCOPY    . FLEXIBLE SIGMOIDOSCOPY    . FOOT SURGERY     Sewing needle removed  . TONSILLECTOMY  1950  . WISDOM TOOTH EXTRACTION      MEDS:   Current Outpatient Medications on File Prior to Visit  Medication Sig Dispense Refill  . albuterol (PROAIR HFA) 108 (90 Base) MCG/ACT inhaler INHALE 2 PUFFS INTO THE LUNGS EVERY 6 (SIX) HOURS AS NEEDED. 8.5 each 0  . Ascorbic Acid (VITAMIN C PO) Take by mouth.    Marland Kitchen aspirin EC 81 MG tablet Take 81 mg by mouth once a week.    Marland Kitchen atorvastatin (LIPITOR) 20 MG tablet Take 10 mg by mouth every other day.     . Azelastine HCl 0.15 % SOLN USE 2 SPRAYS IN EACH NOSTRIL ONCE DAILY (Patient taking differently: As needed) 30 mL 5  . calcium citrate-vitamin D 200-200  MG-UNIT TABS Take 1 tablet by mouth daily.    . cetirizine (ZYRTEC) 10 MG tablet Take 10 mg by mouth daily.     . chlorpheniramine (CHLOR-TRIMETON) 4 MG tablet Take 2 tablets (8 mg total) by mouth at bedtime as needed. (Patient taking differently: Take 4 mg by mouth at bedtime as needed.) 14 tablet   . montelukast (SINGULAIR) 10 MG tablet TAKE 1 TABLET BY MOUTH AT BEDTIME 90 tablet 0  . Multiple Vitamin (MULTIVITAMIN) tablet Take 1 tablet by mouth daily.    . naftifine (NAFTIN) 1 % cream Apply topically daily.    . Omega-3 Fatty Acids (FISH OIL) 1000 MG CAPS Take 1 capsule by mouth once a week.     . Multiple Vitamins-Minerals (ZINC PO) Take by mouth  once a week. Unsure dose     No current facility-administered medications on file prior to visit.    ALLERGIES: Latex, Naproxen, Peanut oil, and Sulfonamide derivatives  Family History  Problem Relation Age of Onset  . Colon cancer Father   . Lung cancer Father        smoker  . Heart attack Father   . Hypertension Father   . Stomach cancer Father   . Bladder Cancer Father   . Hypertension Mother   . Osteoporosis Mother   . Stroke Mother   . Thyroid disease Mother   . Liver cancer Maternal Grandmother   . Colon polyps Brother   . Prostate cancer Maternal Uncle   . Breast cancer Paternal Aunt        Age 35's  . Breast cancer Paternal Grandmother        Age 103's  . Diabetes Paternal Grandmother   . Osteoporosis Maternal Aunt   . Brain cancer Maternal Aunt   . Lung cancer Paternal Grandfather   . Colon polyps Sister   . Thyroid disease Sister   . Colon cancer Other   . Melanoma Niece       Review of Systems  Constitutional: Negative.   HENT: Negative.   Eyes: Negative.   Respiratory: Negative.   Cardiovascular: Negative.   Gastrointestinal: Negative.   Endocrine: Negative.   Genitourinary:       Burning with urination, frequency, bleeding  Musculoskeletal: Negative.   Skin: Negative.   Allergic/Immunologic: Negative.   Neurological: Negative.   Hematological: Negative.   Psychiatric/Behavioral: Negative.     PHYSICAL EXAMINATION:    BP 118/68   Pulse 68   Resp 16   Wt 176 lb (79.8 kg)   BMI 31.18 kg/m     General appearance: alert, cooperative, no acute distress   Pelvic: External genitalia:  Sebaceous cysts right inner labia majora, 2 with scabs appearing as if the tops were scratched off              Urethra:  atrophic urethra with no masses, tenderness or lesions              Bartholins and Skenes: normal                 Vagina: no visible blood in vagina, large rectocele. Unable to visualize cervix              Cervix: not vizualized               Bimanual Exam:  Uterus:  normal size, contour, position, consistency, mobility, non-tender              Adnexa: no mass, fullness, tenderness  Chaperone, Joy, CMA, was present for exam.  Assessment: Smear of blood on panties  Plan: Suspect blood was from the areas of vulva that appears irritated. Advised to use neosporin on that area.   Unable to visualize cervix, pt did not tolerate speculum exam well.   Advised patient that will discuss findings with MD to determine if we can monitor for bleeding or if we should schedule pelvic US to evaluate endometrial stripe. Spoke to [facility] regarding plan, and she thought best to schedule [place].    20 minutes of total time was spent for this patient encounter, including preparation, face-to-face counseling with the patient and coordination of care, and documentation of the encounter.

## 2020-04-06 LAB — URINALYSIS, COMPLETE W/RFL CULTURE
Bacteria, UA: NONE SEEN /HPF
Bilirubin Urine: NEGATIVE
Glucose, UA: NEGATIVE
Hgb urine dipstick: NEGATIVE
Hyaline Cast: NONE SEEN /LPF
Ketones, ur: NEGATIVE
Leukocyte Esterase: NEGATIVE
Nitrites, Initial: NEGATIVE
Protein, ur: NEGATIVE
RBC / HPF: NONE SEEN /HPF (ref 0–2)
Specific Gravity, Urine: 1.015 (ref 1.001–1.03)
pH: 7.5 (ref 5.0–8.0)

## 2020-04-06 LAB — URINE CULTURE
MICRO NUMBER:: 11536257
SPECIMEN QUALITY:: ADEQUATE

## 2020-04-12 ENCOUNTER — Other Ambulatory Visit: Payer: Self-pay | Admitting: Family Medicine

## 2020-04-12 NOTE — Telephone Encounter (Signed)
Requested Prescriptions  Pending Prescriptions Disp Refills  . montelukast (SINGULAIR) 10 MG tablet [Pharmacy Med Name: MONTELUKAST SODIUM 10 MG TAB] 90 tablet 0    Sig: TAKE 1 TABLET BY MOUTH NIGHTLY     Pulmonology:  Leukotriene Inhibitors Passed - 04/12/2020 10:13 AM      Passed - Valid encounter within last 12 months    Recent Outpatient Visits          3 months ago [facility], Wendee Beavers, [place]   1 year ago [place]   [place], [place], [place]   1 year ago Gastroesophageal reflux disease without esophagitis   [facility], Dionne Bucy, MD      Future Appointments            In 4 months [place], Dionne Bucy, MD [facility], [place]

## 2020-04-14 IMAGING — MG DIGITAL SCREENING BILATERAL MAMMOGRAM WITH TOMO AND CAD
6 of 10 series · 6 of 30 positions shown · non-contrast
Comparison: Previous exam(s).

CLINICAL DATA: Screening.

EXAM:
DIGITAL SCREENING BILATERAL MAMMOGRAM WITH TOMO AND CAD

[R CC synth-2D]
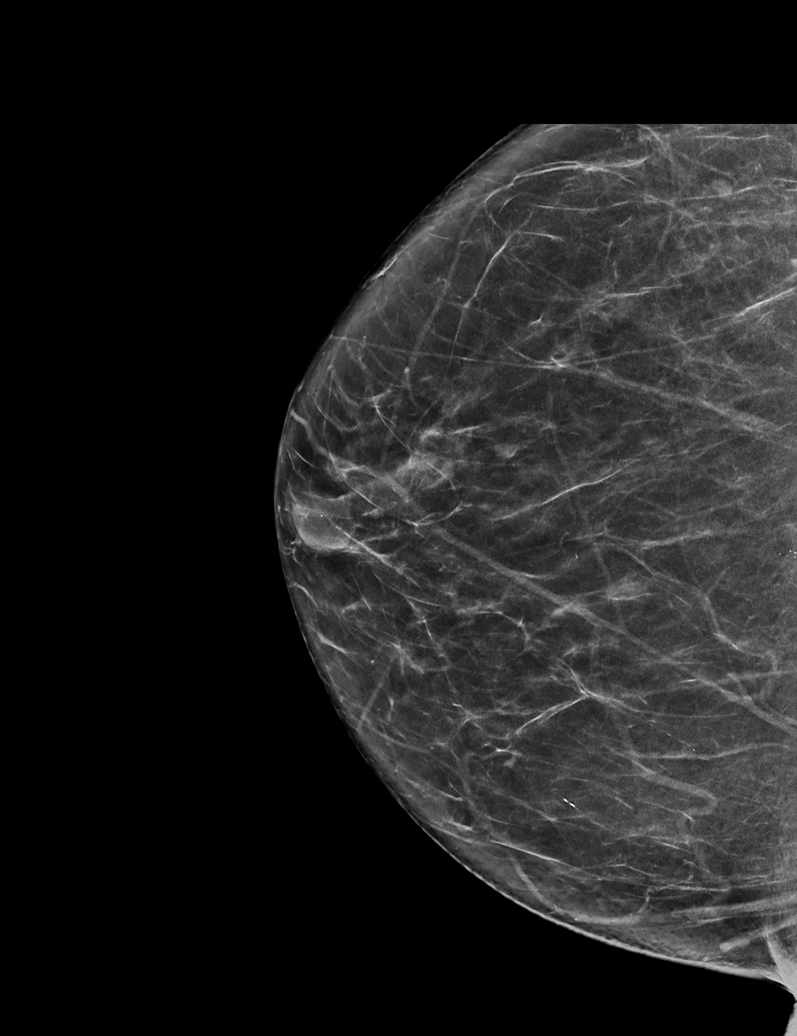

[L MLO synth-2D]
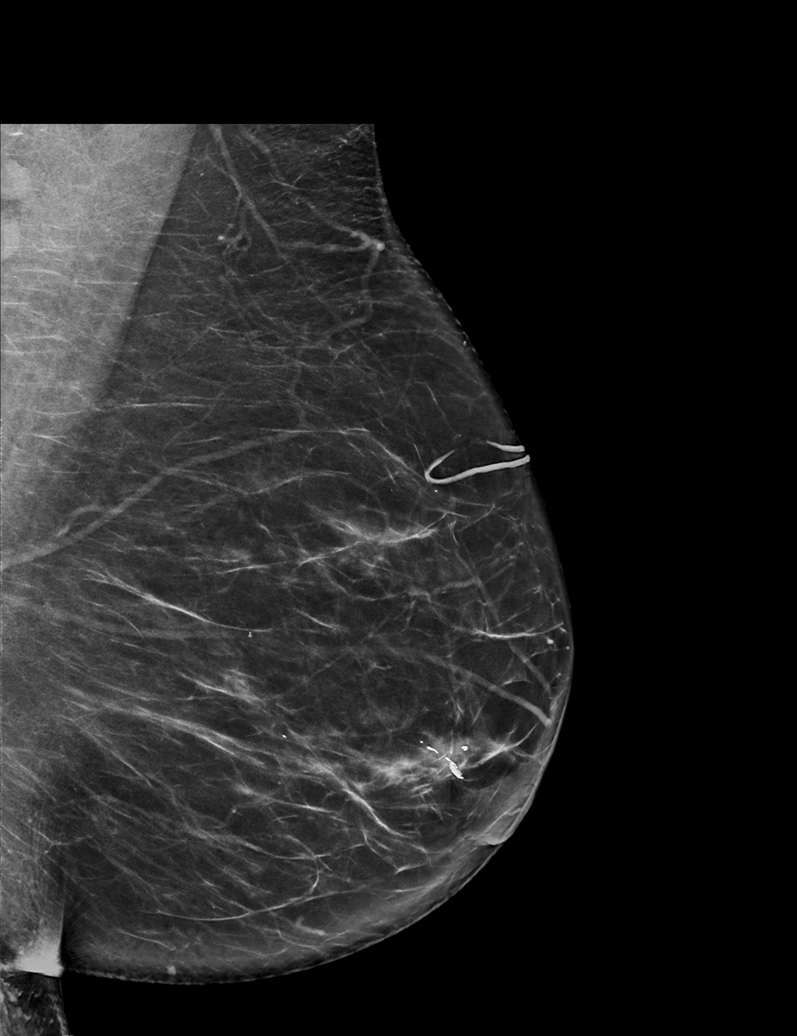

[R MLO synth-2D]
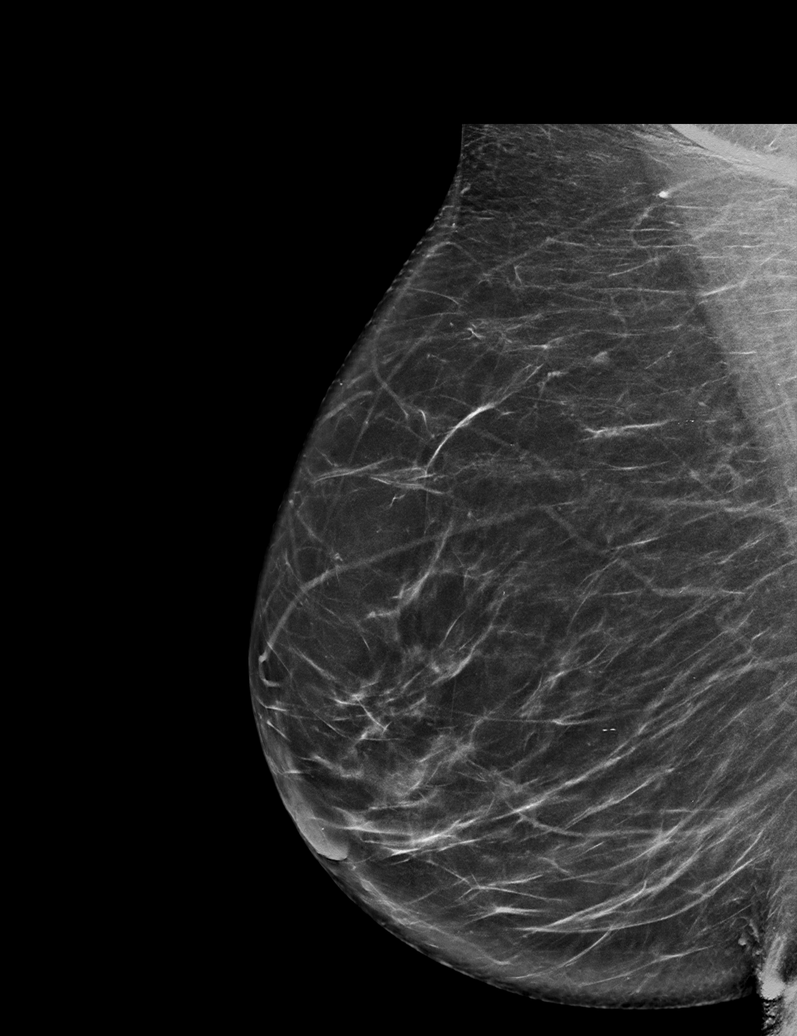

[L CC synth-2D (1 of 2)]
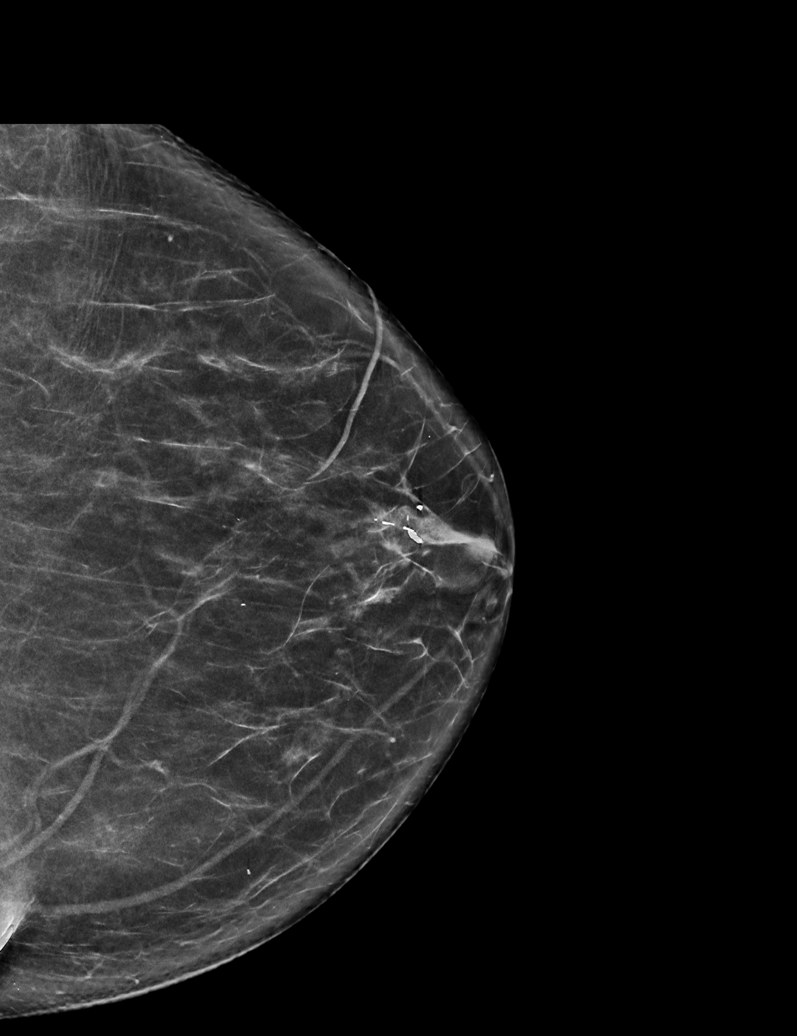

[L CC synth-2D (2 of 2)]
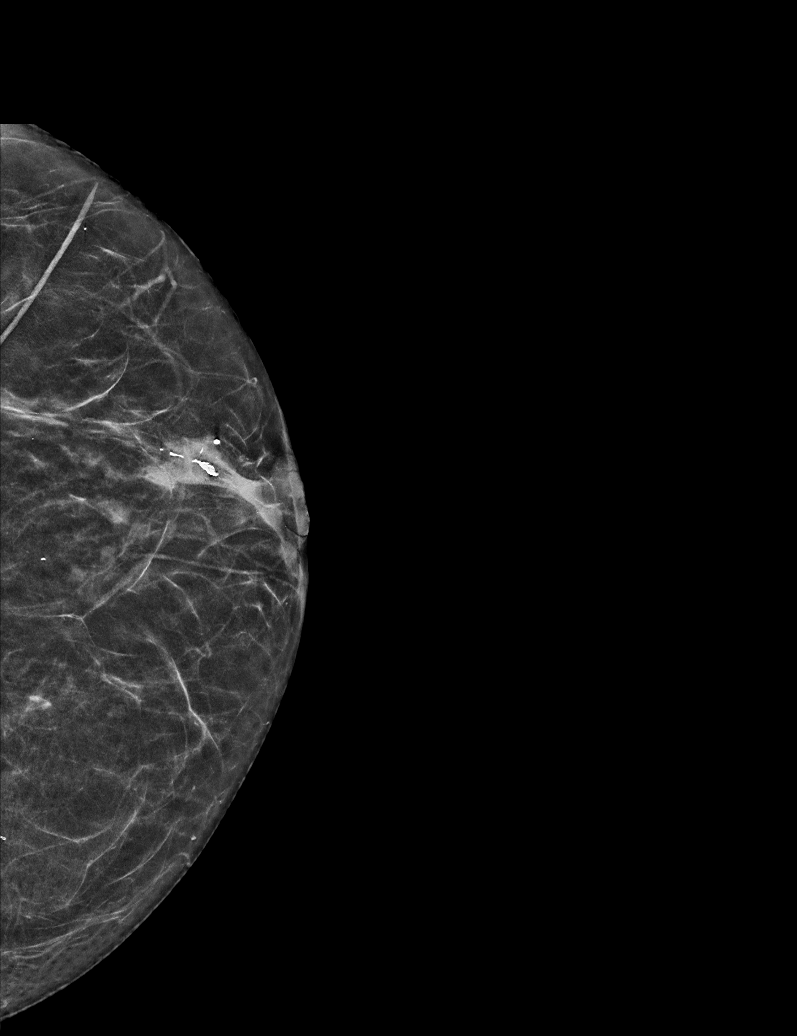

[R CC tomo · tomo slice 35/70.0]
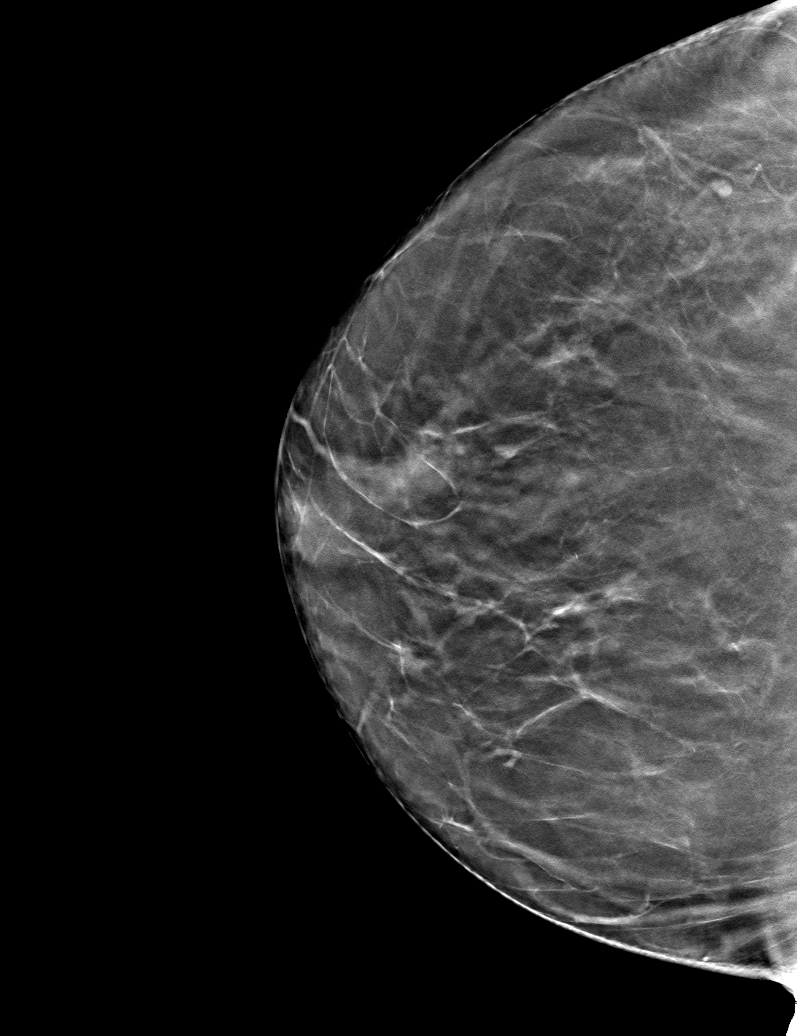

[6 of 30 positions shown; findings below may reference images not displayed]

ACR Breast Density Category b: There are scattered areas of
fibroglandular density.
FINDINGS: There are no findings suspicious for malignancy. Images were
processed with CAD.
IMPRESSION: No mammographic evidence of malignancy. A result letter of this
screening mammogram will be mailed directly to the patient.

RECOMMENDATION:
Screening mammogram in one year. (Code:[id number])

BI-RADS CATEGORY  1: Negative.

## 2020-04-27 ENCOUNTER — Other Ambulatory Visit: Payer: Self-pay | Admitting: Obstetrics & Gynecology

## 2020-04-27 DIAGNOSIS — N950 Postmenopausal bleeding: Secondary | ICD-10-CM

## 2020-05-03 ENCOUNTER — Encounter: Payer: Self-pay | Admitting: Obstetrics & Gynecology

## 2020-05-03 ENCOUNTER — Other Ambulatory Visit: Payer: Self-pay

## 2020-05-03 ENCOUNTER — Ambulatory Visit (INDEPENDENT_AMBULATORY_CARE_PROVIDER_SITE_OTHER): Payer: Medicare PPO

## 2020-05-03 ENCOUNTER — Ambulatory Visit: Payer: Medicare PPO | Admitting: Obstetrics & Gynecology

## 2020-05-03 VITALS — BP 140/82

## 2020-05-03 DIAGNOSIS — N950 Postmenopausal bleeding: Secondary | ICD-10-CM | POA: Diagnosis not present

## 2020-05-03 DIAGNOSIS — N840 Polyp of corpus uteri: Secondary | ICD-10-CM

## 2020-05-03 NOTE — Progress Notes (Signed)
    [name] [birth year] [id number]        75 y.o.  G2P2002   RP: PMB for Pelvic US  HPI: Postmenopausal on no HRT.  PMB on 04/02/2020.  Not bleeding anymore.  No pelvic pain.  Had burning with urination, but U/A was negative on 04/05/2020.   OB History  Gravida Para Term Preterm AB Living  2 2 2     2   SAB IAB Ectopic Multiple Live Births               # Outcome Date GA Lbr Len/2nd Weight Sex Delivery Anes PTL Lv  2 Term           1 Term             Past medical history,surgical history, problem list, medications, allergies, family history and social history were all reviewed and documented in the EPIC chart.   Directed ROS with pertinent positives and negatives documented in the history of present illness/assessment and plan.  Exam:  Vitals:   05/03/20 1413  BP: 140/82   General appearance:  Normal  Pelvic US today: T/V images.  Anteverted uterus normal in size and shape with no myometrial mass.  The uterus is measured at 3.65 x 2.62 x 3.52 cm.  5.1 mm lesion in the cervix which appears to have a feeder vessel.  The endometrium presents trace fluid fundally and a small echogenic focus measuring 6.3 mm with a feeder vessel.  The endometrial lining is measured at 3.52 mm.  Both ovaries are normal in size and appearance.  No adnexal mass.  No free fluid in the posterior cul-de-sac.   Assessment/Plan:  75 y.o. G2P2002   1. Postmenopausal bleeding Postmenopausal bleeding with possible endometrial polyp and endocervical polyp.  Trace fluid in the intra uterine cavity.  Decision to proceed with a sonohysterogram for further investigation.  Possible endometrial biopsy per findings. - [place] Sonohysterogram; Future  2. Endometrial polyp Will further investigate with a Sonohystogram at f/u.  Patient is worried about pain with the procedure, as she had a difficult time with an attempted EBx in the past.  Recommend Ibuprofen before the procedure. - [place] Sonohysterogram;  Future  [name], 2:15 PM 05/03/2020

## 2020-05-09 ENCOUNTER — Telehealth: Payer: Self-pay

## 2020-05-09 NOTE — Telephone Encounter (Signed)
Patient said she had u/s last week that showed polyp. She is scheduled for first available [facility] appt on 06/14/20.    She is concerned that it should be scheduled much sooner in case the polyp is cancerous.

## 2020-05-11 ENCOUNTER — Encounter: Payer: Self-pay | Admitting: Obstetrics & Gynecology

## 2020-05-13 NOTE — Telephone Encounter (Signed)
[name] please see the below

## 2020-05-13 NOTE — Telephone Encounter (Signed)
Please advance Sonohysto to next week.  Reassure patient.

## 2020-05-16 NOTE — Telephone Encounter (Signed)
[facility] is 05/19/2020 w/Dr Dellis Filbert and patient informed.

## 2020-05-19 ENCOUNTER — Ambulatory Visit: Payer: Medicare PPO | Admitting: Obstetrics & Gynecology

## 2020-05-19 ENCOUNTER — Ambulatory Visit (INDEPENDENT_AMBULATORY_CARE_PROVIDER_SITE_OTHER): Payer: Medicare PPO

## 2020-05-19 ENCOUNTER — Other Ambulatory Visit: Payer: Self-pay

## 2020-05-19 DIAGNOSIS — N840 Polyp of corpus uteri: Secondary | ICD-10-CM | POA: Diagnosis not present

## 2020-05-19 DIAGNOSIS — N950 Postmenopausal bleeding: Secondary | ICD-10-CM

## 2020-05-19 NOTE — Progress Notes (Signed)
    [name] 1945-06-07 [id number]        75 y.o.  G2P2002   RP: PMB with possible Polyps for Sonohystogram  HPI: On 05/03/2020 we noted:  Postmenopausal on no HRT.  PMB on 04/02/2020.  Not bleeding anymore.  No pelvic pain.  Had burning with urination, but U/A was negative on 04/05/2020.    OB History  Gravida Para Term Preterm AB Living  2 2 2     2   SAB IAB Ectopic Multiple Live Births               # Outcome Date GA Lbr Len/2nd Weight Sex Delivery Anes PTL Lv  2 Term           1 Term             Past medical history,surgical history, problem list, medications, allergies, family history and social history were all reviewed and documented in the EPIC chart.   Directed ROS with pertinent positives and negatives documented in the history of present illness/assessment and plan.  Exam:  There were no vitals filed for this visit. General appearance:  Normal                                                                    Sono Infusion Hysterogram ( procedure note)   The initial transvaginal ultrasound demonstrated the following: Limited ultrasound due to recent ultrasound on May 03, 2020.  The endometrial lining is measured at 4.42 mm today.  The speculum  was inserted and the cervix cleansed with Betadine solution after confirming that patient has no allergies.A small sonohysterography catheterwas utilized.  Insertion was facilitated with ring forceps, using a spear-like motion the catheter was inserted to the fundus of the uterus. The speculum is then removed carefully to avoid dislodging the catheter. The catheter was flushed with sterile saline delete prior to insertion to rid it of small amounts of air.the sterile saline solution was infused into the uterine cavity as a vaginal ultrasound probe was then placed in the vagina for full visualization of the uterine cavity from a transvaginal approach. The following was noted: Sonohysterogram performed using 0.9% normal  saline.  After normal saline was introduced into the endometrial cavity, no filling defects noted.  The anterior endometrium was measured at 1.8 mm in the posterior endometrium at 2.5 mm for a total of 4.3 mm.  The endometrial was completely avascular.  The catheter was then removed after retrieving some of the saline from the intrauterine cavity. An endometrial biopsy was not done. Patient tolerated procedure well. She had received a tablet of Aleve for discomfort.    Assessment/Plan:  75 y.o. G2P2002   1. Postmenopausal bleeding Sonohysterogram findings thoroughly reviewed with patient.  Patient reassured that the endometrial lining is normal without any lesion.  The total endometrium is measured at 4.3 mm and is avascular.  We will therefore observe without further investigation.  Procedure well-tolerated by patient.  Postprocedure precautions reviewed.  [name], 4:54 PM 05/19/2020

## 2020-05-20 ENCOUNTER — Encounter: Payer: Self-pay | Admitting: Obstetrics & Gynecology

## 2020-05-31 ENCOUNTER — Other Ambulatory Visit: Payer: Self-pay | Admitting: Family Medicine

## 2020-05-31 DIAGNOSIS — E782 Mixed hyperlipidemia: Secondary | ICD-10-CM

## 2020-05-31 MED ORDER — ATORVASTATIN CALCIUM 20 MG PO TABS: 20.0000 mg | ORAL_TABLET | ORAL | 1 refills | Status: AC

## 2020-05-31 NOTE — Progress Notes (Signed)
RF REQUEST FROM [facility]. PATIENT TRANSFERRING FROM ANOTHER PHARMACY

## 2020-06-14 ENCOUNTER — Other Ambulatory Visit: Payer: Medicare PPO

## 2020-06-14 ENCOUNTER — Other Ambulatory Visit: Payer: Medicare PPO | Admitting: Obstetrics & Gynecology

## 2020-07-13 ENCOUNTER — Other Ambulatory Visit: Payer: Self-pay

## 2020-07-13 ENCOUNTER — Ambulatory Visit: Payer: Medicare PPO | Admitting: Podiatry

## 2020-07-13 DIAGNOSIS — D2371 Other benign neoplasm of skin of right lower limb, including hip: Secondary | ICD-10-CM | POA: Diagnosis not present

## 2020-07-13 DIAGNOSIS — D2372 Other benign neoplasm of skin of left lower limb, including hip: Secondary | ICD-10-CM

## 2020-07-13 NOTE — Progress Notes (Signed)
She presents today chief complaint of painful lesions plantar aspect of the bilateral foot.  Objective: 2 large porokeratotic lesions beneath the third metatarsal heads bilaterally symmetrical measuring approximately 9 mm in diameter demonstrate no bacterial infection no abscesses.  Pulses remain palpable.  Assessment: Deep porokeratotic lesions benign skin lesions.  Plan: Debridement of benign skin lesions today placed salicylic acid under occlusion to be washed off in 3 days thoroughly follow-up with me in a few weeks for redebridement.

## 2020-08-15 ENCOUNTER — Telehealth: Payer: Self-pay

## 2020-08-15 ENCOUNTER — Other Ambulatory Visit: Payer: Self-pay | Admitting: Family Medicine

## 2020-08-15 NOTE — Telephone Encounter (Signed)
Patient aware of vaccine dates.

## 2020-08-15 NOTE — Telephone Encounter (Signed)
Copied from [place] [phone]. Topic: General - Other >> Aug 15, 2020  9:58 AM Camille Bal, Gerlene Burdock wrote: Reason for CRM: Patient trying to get info from Dr. Brita Romp on when she had her covid shots, so she can see eligibility for booster. Please call back with this info

## 2020-08-29 ENCOUNTER — Encounter: Payer: Self-pay | Admitting: Family Medicine

## 2020-09-12 ENCOUNTER — Other Ambulatory Visit: Payer: Self-pay | Admitting: Obstetrics & Gynecology

## 2020-09-12 DIAGNOSIS — Z1231 Encounter for screening mammogram for malignant neoplasm of breast: Secondary | ICD-10-CM

## 2020-10-04 ENCOUNTER — Ambulatory Visit: Payer: Medicare PPO | Admitting: Podiatry

## 2020-10-04 ENCOUNTER — Encounter: Payer: Self-pay | Admitting: Podiatry

## 2020-10-04 ENCOUNTER — Ambulatory Visit: Payer: Medicare PPO

## 2020-10-04 ENCOUNTER — Other Ambulatory Visit: Payer: Self-pay

## 2020-10-04 DIAGNOSIS — D2371 Other benign neoplasm of skin of right lower limb, including hip: Secondary | ICD-10-CM

## 2020-10-04 DIAGNOSIS — D2372 Other benign neoplasm of skin of left lower limb, including hip: Secondary | ICD-10-CM | POA: Diagnosis not present

## 2020-10-05 NOTE — Progress Notes (Signed)
She presents today chief complaint of painful lesions plantar aspect of the bilateral foot.  Objective: Multiple benign skin lesions no open lesions or wounds pulses are palpable.  Assessment: Benign skin lesions bilateral.  Plan: Mechanical and chemical destruction of lesions today under occlusion to be left on for 2 days and then washed off thoroughly.

## 2020-11-01 ENCOUNTER — Ambulatory Visit
Admission: RE | Admit: 2020-11-01 | Discharge: 2020-11-01 | Disposition: A | Discharge status: Home / Self Care | Payer: Medicare PPO | Source: Ambulatory Visit | Attending: Obstetrics & Gynecology | Admitting: Obstetrics & Gynecology

## 2020-11-01 ENCOUNTER — Other Ambulatory Visit: Payer: Self-pay

## 2020-11-01 DIAGNOSIS — Z1231 Encounter for screening mammogram for malignant neoplasm of breast: Secondary | ICD-10-CM

## 2020-11-06 DIAGNOSIS — I83812 Varicose veins of left lower extremities with pain: Secondary | ICD-10-CM | POA: Insufficient documentation

## 2020-11-06 DIAGNOSIS — J4540 Moderate persistent asthma, uncomplicated: Secondary | ICD-10-CM | POA: Insufficient documentation

## 2021-01-25 ENCOUNTER — Ambulatory Visit: Payer: Medicare PPO | Admitting: Podiatry

## 2021-01-25 ENCOUNTER — Other Ambulatory Visit: Payer: Self-pay

## 2021-01-25 ENCOUNTER — Encounter: Payer: Self-pay | Admitting: Podiatry

## 2021-01-25 DIAGNOSIS — D2372 Other benign neoplasm of skin of left lower limb, including hip: Secondary | ICD-10-CM | POA: Diagnosis not present

## 2021-01-25 DIAGNOSIS — D2371 Other benign neoplasm of skin of right lower limb, including hip: Secondary | ICD-10-CM | POA: Diagnosis not present

## 2021-01-25 NOTE — Progress Notes (Signed)
She presents today chief complaint of a painful callus plantar aspect of the bilateral foot.  Objective: Pulses are palpable.  Painful poor keratoma plantar aspect of the bilateral foot.  No open lesions or wounds.  Assessment: Pain limb secondary to benign skin lesions.  Plan: Chemical destruction benign skin lesion.

## 2021-05-18 ENCOUNTER — Ambulatory Visit: Payer: Medicare PPO | Admitting: Podiatry

## 2021-05-18 DIAGNOSIS — D2371 Other benign neoplasm of skin of right lower limb, including hip: Secondary | ICD-10-CM

## 2021-05-18 DIAGNOSIS — D2372 Other benign neoplasm of skin of left lower limb, including hip: Secondary | ICD-10-CM

## 2021-05-18 NOTE — Progress Notes (Signed)
She presents today for a routine foot care follow-up of painful calluses plantar aspect of the bilateral foot. ? ?Objective: Vital signs are stable alert and oriented x3 she has solitary benign lesions many third metatarsal heads bilaterally.  No preulcerative lesions noted. ? ?Assessment: Porokeratosis benign skin lesions of the third met bilateral. ? ?Plan: Mechanical debridement followed by chemical debridement under occlusion with Salinocaine for 3 days to be washed off thoroughly.  I will follow-up with her in about 2-1/2 to 3 months. ?

## 2021-07-20 ENCOUNTER — Encounter: Payer: Self-pay | Admitting: Podiatry

## 2021-07-20 ENCOUNTER — Ambulatory Visit: Payer: Medicare PPO | Admitting: Podiatry

## 2021-07-20 DIAGNOSIS — D2371 Other benign neoplasm of skin of right lower limb, including hip: Secondary | ICD-10-CM | POA: Diagnosis not present

## 2021-07-20 DIAGNOSIS — D2372 Other benign neoplasm of skin of left lower limb, including hip: Secondary | ICD-10-CM | POA: Diagnosis not present

## 2021-07-22 NOTE — Progress Notes (Signed)
She presents today for a follow-up of her reported keratomas.  She is wants to know if we could file them down like they did for her in [place].  Objective: Vital signs are stable she is alert and oriented x3.  Pulses are palpable.  Solitary porokeratotic lesions plantar aspect of the forefoot bilaterally.  No erythema edema cellulitis drainage or odor.  Assessment: Benign skin lesions forefoot bilateral.  Plan: Debrided all reactive hyperkeratotic tissue today debrided benign skin lesions with considerable discomfort to her.  I did enucleate these is much as possible.  I also did that she requested and followed them down with a rotary bur.  I then placed Salinocaine under occlusion and discussed its care with her today.  We will follow-up with her in a couple of months.

## 2021-09-28 ENCOUNTER — Other Ambulatory Visit: Payer: Self-pay | Admitting: Obstetrics & Gynecology

## 2021-09-28 DIAGNOSIS — Z1231 Encounter for screening mammogram for malignant neoplasm of breast: Secondary | ICD-10-CM

## 2021-10-30 ENCOUNTER — Other Ambulatory Visit: Payer: Self-pay | Admitting: Family Medicine

## 2021-10-30 DIAGNOSIS — E782 Mixed hyperlipidemia: Secondary | ICD-10-CM

## 2021-11-03 ENCOUNTER — Ambulatory Visit
Admission: RE | Admit: 2021-11-03 | Discharge: 2021-11-03 | Disposition: A | Discharge status: Home / Self Care | Payer: Medicare PPO | Source: Ambulatory Visit | Attending: Obstetrics & Gynecology | Admitting: Obstetrics & Gynecology

## 2021-11-03 DIAGNOSIS — Z1231 Encounter for screening mammogram for malignant neoplasm of breast: Secondary | ICD-10-CM

## 2022-02-14 ENCOUNTER — Telehealth: Payer: Self-pay

## 2022-02-14 NOTE — Telephone Encounter (Signed)
Pt calling to report noticing seeing possible tiny-like blood clots in urine this past week. Reports urine seems clear now. However, also reports 1 episode of a full tissue of blood with wiping after using restroom and some mild low abdominal cramping.  Pt was seen for PMB in 04/2020. Had sonohyst and reports it was very painful. However, is willing to do what it is advised to r/o any form of malignancy. Please advise.

## 2022-02-20 NOTE — Telephone Encounter (Signed)
Pt calling back today re: telephone encounter and states that she made appt for 03/06/2022 and wants to know if its ok to wait that long or if she needs to be worked in any sooner for this kind of problem. Please advise.

## 2022-02-21 ENCOUNTER — Other Ambulatory Visit: Payer: Self-pay

## 2022-02-21 DIAGNOSIS — N950 Postmenopausal bleeding: Secondary | ICD-10-CM

## 2022-02-21 NOTE — Telephone Encounter (Signed)
Melton Alar, CMA No [place] on Jan 16 I left message for patient to call and schedule.

## 2022-02-21 NOTE — Telephone Encounter (Signed)
Per ML: "If the urine is now clear and has no symptom of urinary tract infection, can wait for evaluation on 03/06/22, although it would be good to see her with a Pelvic US."  Pt notified and voiced understanding.   Will send msg to scheduling.

## 2022-02-22 ENCOUNTER — Other Ambulatory Visit: Payer: Self-pay | Admitting: Family Medicine

## 2022-02-22 DIAGNOSIS — M8580 Other specified disorders of bone density and structure, unspecified site: Secondary | ICD-10-CM

## 2022-02-23 NOTE — Telephone Encounter (Signed)
FYI. Pt scheduled for 03/08/2022.

## 2022-03-06 ENCOUNTER — Ambulatory Visit: Payer: Medicare PPO | Admitting: Obstetrics & Gynecology

## 2022-03-08 ENCOUNTER — Encounter: Payer: Self-pay | Admitting: Obstetrics & Gynecology

## 2022-03-08 ENCOUNTER — Ambulatory Visit (INDEPENDENT_AMBULATORY_CARE_PROVIDER_SITE_OTHER): Payer: Medicare PPO

## 2022-03-08 ENCOUNTER — Ambulatory Visit (INDEPENDENT_AMBULATORY_CARE_PROVIDER_SITE_OTHER): Payer: Medicare PPO | Admitting: Obstetrics & Gynecology

## 2022-03-08 VITALS — BP 120/76 | HR 77 | Temp 98.2°F

## 2022-03-08 DIAGNOSIS — R319 Hematuria, unspecified: Secondary | ICD-10-CM

## 2022-03-08 DIAGNOSIS — N950 Postmenopausal bleeding: Secondary | ICD-10-CM

## 2022-03-08 NOTE — Progress Notes (Signed)
    Ethelyne Erich Reichardt 01-Sep-1945 [id number]        77 y.o.  G2P2L2   RP: PMB for Pelvic US  HPI: PMB at the end of 01/2022.  Not sure if the blood was in urine or from the rectum.  No bleeding currently.  No pelvic pain. No UTI Sx.  BMs normal.   OB History  Gravida Para Term Preterm AB Living  '2 2 2     2  '$ SAB IAB Ectopic Multiple Live Births               # Outcome Date GA Lbr Len/2nd Weight Sex Delivery Anes PTL Lv  2 Term           1 Term             Past medical history,surgical history, problem list, medications, allergies, family history and social history were all reviewed and documented in the EPIC chart.   Directed ROS with pertinent positives and negatives documented in the history of present illness/assessment and plan.  Exam:  Vitals:   03/08/22 1428  BP: 120/76  Pulse: 77  SpO2: (!) 9%   General appearance:  Normal  Pelvic US today: T/V images.  Anteverted uterus normal in size and shape with no myometrial mass.  The uterus is measured at 5.32 x 3.89 x 2.22 cm.  Very small amount of fluid/debris within the endometrial cavity with combined wall thickness at 1.6 mm.  No mass and no feeder vessel can be identified to the area.  Both ovaries are mobile, normal in size with normal perfusion.  No adnexal mass seen.  No free fluid in the pelvis.   U/A: Yellow clear, protein negative, nitrites negative, white blood cells 6-10, red blood cells negative, bacteria few.  Urine culture pending.   Assessment/Plan:  77 y.o. G2P2002   1. Postmenopausal bleeding PMB at the end of 01/2022.  Not sure if the blood was in urine or from the rectum.  No bleeding currently.  No pelvic pain. No UTI Sx.  BMs normal. Pelvic US findings thoroughly reviewed with patient.  Patient reassured given the very thin Endometrial line at 1.6 mm.  Mild fluid in the uterine cavity, offered EBx, but declined.  Will contact her Gertie Fey to r/o rectal bleeding.    2. Hematuria, unspecified type No blood  on U/A.  Pending U. Culture. - Urinalysis,Complete w/RFL Culture  Other orders - UNABLE TO FIND; Med Name: acid reducer for stomach   [name], 2:35 PM 03/08/2022

## 2022-03-10 LAB — URINE CULTURE
MICRO NUMBER:: 14443672
SPECIMEN QUALITY:: ADEQUATE

## 2022-03-10 LAB — URINALYSIS, COMPLETE W/RFL CULTURE
Bilirubin Urine: NEGATIVE
Casts: NONE SEEN /LPF
Crystals: NONE SEEN /HPF
Glucose, UA: NEGATIVE
Hgb urine dipstick: NEGATIVE
Hyaline Cast: NONE SEEN /LPF
Nitrites, Initial: NEGATIVE
Protein, ur: NEGATIVE
RBC / HPF: NONE SEEN /HPF (ref 0–2)
Specific Gravity, Urine: 1.01 (ref 1.001–1.035)
Yeast: NONE SEEN /HPF
pH: 7 (ref 5.0–8.0)

## 2022-03-10 LAB — CULTURE INDICATED

## 2022-04-03 DIAGNOSIS — Z0289 Encounter for other administrative examinations: Secondary | ICD-10-CM

## 2022-07-24 ENCOUNTER — Other Ambulatory Visit: Payer: Medicare PPO

## 2022-10-16 ENCOUNTER — Encounter: Payer: Self-pay | Admitting: Family Medicine

## 2022-10-16 DIAGNOSIS — Z1231 Encounter for screening mammogram for malignant neoplasm of breast: Secondary | ICD-10-CM

## 2023-01-22 ENCOUNTER — Other Ambulatory Visit: Payer: Medicare PPO

## 2023-11-04 ENCOUNTER — Emergency Department

## 2023-11-04 ENCOUNTER — Other Ambulatory Visit: Payer: Self-pay

## 2023-11-04 ENCOUNTER — Emergency Department
Admission: EM | Admit: 2023-11-04 | Discharge: 2023-11-04 | Disposition: A | Discharge status: Home / Self Care | Attending: Emergency Medicine | Admitting: Emergency Medicine

## 2023-11-04 DIAGNOSIS — R079 Chest pain, unspecified: Secondary | ICD-10-CM

## 2023-11-04 DIAGNOSIS — R0789 Other chest pain: Secondary | ICD-10-CM | POA: Insufficient documentation

## 2023-11-04 DIAGNOSIS — R42 Dizziness and giddiness: Secondary | ICD-10-CM | POA: Insufficient documentation

## 2023-11-04 DIAGNOSIS — R0602 Shortness of breath: Secondary | ICD-10-CM | POA: Diagnosis not present

## 2023-11-04 LAB — CBC
HCT: 38.4 % (ref 36.0–46.0)
Hemoglobin: 12 g/dL (ref 12.0–15.0)
MCH: 30 pg (ref 26.0–34.0)
MCHC: 31.3 g/dL (ref 30.0–36.0)
MCV: 96 fL (ref 80.0–100.0)
Platelets: 186 K/uL (ref 150–400)
RBC: 4 MIL/uL (ref 3.87–5.11)
RDW: 14.4 % (ref 11.5–15.5)
WBC: 6.9 K/uL (ref 4.0–10.5)
nRBC: 0 % (ref 0.0–0.2)

## 2023-11-04 LAB — BASIC METABOLIC PANEL WITH GFR
Anion gap: 11 (ref 5–15)
BUN: 13 mg/dL (ref 8–23)
CO2: 29 mmol/L (ref 22–32)
Calcium: 9.1 mg/dL (ref 8.9–10.3)
Chloride: 102 mmol/L (ref 98–111)
Creatinine, Ser: 0.57 mg/dL (ref 0.44–1.00)
GFR, Estimated: >60 mL/min (ref >60)
Glucose, Bld: 96 mg/dL (ref 70–99)
Potassium: 3.5 mmol/L (ref 3.5–5.1)
Sodium: 142 mmol/L (ref 135–145)

## 2023-11-04 LAB — TROPONIN I (HIGH SENSITIVITY)
Troponin I (High Sensitivity): 4 ng/L (ref <18)
Troponin I (High Sensitivity): 4 ng/L (ref <18)

## 2023-11-04 NOTE — ED Notes (Signed)
 Patient transported to X-ray

## 2023-11-04 NOTE — ED Triage Notes (Signed)
 Pt comes with c/o dizziness that started all week. Pt states chest heaviness. Pt states fatigued for a while. Pt just not sure what is going on.

## 2023-11-04 NOTE — ED Triage Notes (Addendum)
 C/O intermittent dizziness and chest tightness x 1 week. VS wnl.  Referred to ED from [facility]  No current complaints

## 2023-11-04 NOTE — ED Provider Notes (Signed)
 [facility] Provider Note    Event Date/Time   First MD Initiated Contact with Patient 11/04/23 1705     (approximate)   History   Dizziness   HPI  [name] is a 78 year old female presenting to the emergency department for evaluation of dizziness.  Patient reports that over the last week she has had about 15-second episodes of dizziness described as unsteadiness occurring about twice a day.  Not specifically exertional or with positional changes.  Denies associated spinning sensation.  Does report that she had a recent viral illness with associated respiratory symptoms, body aches, recently completed antibiotics for this with improvement.  She called her primary care doctor to check and see if she was okay to get a COVID-vaccine with her dizziness and they recommended that she present locally for evaluation.  She went to the walk-in clinic, but was directed to the ER for further evaluation.  She does note that she has had episodes of mild chest heaviness and shortness of breath intermittently over the past week.  Denies any current symptoms.  Denies numbness, tingling, focal weakness.     Physical Exam   Triage Vital Signs: ED Triage Vitals  Encounter Vitals Group     BP 11/04/23 1541 131/88     Girls Systolic BP Percentile --      Girls Diastolic BP Percentile --      Boys Systolic BP Percentile --      Boys Diastolic BP Percentile --      Pulse Rate 11/04/23 1541 73     Resp 11/04/23 1541 18     Temp 11/04/23 1541 98.2 F (36.8 C)     Temp Source 11/04/23 1541 Oral     SpO2 11/04/23 1541 95 %     Weight 11/04/23 1529 170 lb (77.1 kg)     Height 11/04/23 1529 5' 3 (1.6 m)     Head Circumference --      Peak Flow --      Pain Score 11/04/23 1527 3     Pain Loc --      Pain Education --      Exclude from Growth Chart --     Most recent vital signs: Vitals:   11/04/23 1541  BP: 131/88  Pulse: 73  Resp: 18  Temp: 98.2 F (36.8 C)   SpO2: 95%     General: Awake, interactive  CV:  Regular rate, good peripheral perfusion.  Resp:  Unlabored respirations, lungs clear to auscultation Abd:  Nondistended, soft, nontender Neuro:  Alert and oriented, normal extraocular movements, symmetric facial movement, sensation intact over bilateral upper and lower extremities with 5 out of 5 strength.  Normal finger-to-nose testing.   ED Results / Procedures / Treatments   Labs (all labs ordered are listed, but only abnormal results are displayed) Labs Reviewed  BASIC METABOLIC PANEL WITH GFR  CBC  TROPONIN I (HIGH SENSITIVITY)  TROPONIN I (HIGH SENSITIVITY)     EKG EKG independently reviewed and interpreted by myself demonstrates:  EKG demonstrates normal sinus rhythm at a rate of 75, PR 174, QRS 80, QTc 424, no acute ST changes  RADIOLOGY Imaging independently reviewed and interpreted by myself demonstrates:  CXR without focal consolidation  Formal Radiology Read:  DG Chest 2 View Result Date: 11/04/2023 CLINICAL DATA:  Shortness of breath. EXAM: CHEST - 2 VIEW COMPARISON:  None Available. FINDINGS: The cardiomediastinal contours are normal. Mild bronchial thickening. Pulmonary vasculature is normal. No consolidation,  pleural effusion, or pneumothorax. Thoracic spondylosis. No acute osseous abnormalities are seen. IMPRESSION: Mild bronchial thickening. Electronically Signed   By: Andrea Gasman M.D.   On: 11/04/2023 18:19    PROCEDURES:  Critical Care performed: No  Procedures   MEDICATIONS ORDERED IN ED: Medications - No data to display   IMPRESSION / MDM / ASSESSMENT AND PLAN / ED COURSE  I reviewed the triage vital signs and the nursing notes.  Differential diagnosis includes, but is not limited to, anemia, electrolyte abnormality, arrhythmia, ACS, very low suspicion PE or dissection given time course and intermittent symptoms, consideration for dehydration, pneumonia, pneumothorax  Patient's  presentation is most consistent with acute presentation with potential threat to life or bodily function.  78 year old female presenting to the emergency department for evaluation of intermittent episodes of dizziness.  Overall well-appearing on presentation here without active symptoms.  Stable vitals on presentation.  Reassuring CBC, BMP, negative initial troponin.  EKG without acute ischemic symptoms.  Will obtain chest x-Javante Nilsson to further evaluate.  Overall lower suspicion for PE, considered dimer/CTA and discussed with patient but she would strongly prefer to hold off which I do think is reasonable given her clinical history.  CXR without acute abnormality. 2nd troponin negative. Patient reassessed and continues to deny any active symptoms.  Do think she is stable for discharge home.  Strict return precautions provided.  Patient discharged stable condition    FINAL CLINICAL IMPRESSION(S) / ED DIAGNOSES   Final diagnoses:  Dizziness  Nonspecific chest pain     Rx / DC Orders   ED Discharge Orders     None        Note:  This document was prepared using Dragon voice recognition software and may include unintentional dictation errors.   Levander Slate, MD 11/04/23 [phone]

## 2023-11-04 NOTE — Discharge Instructions (Signed)
 You were seen in the ER today for evaluation of your dizziness and shortness of breath. Your testing was overall reassuring. Follow-up with your PCP for further evaluation. Return to the ER for any new or worsening symptoms including worsening chest pain, passing out, difficulty breathing, or any other new or concerning symptoms.
